# Patient Record
Sex: Female | Born: 1985 | Race: White | Hispanic: No | Smoking: Never smoker
Health system: Southern US, Community
[De-identification: ages and names within clinical notes are randomized; demographics above are authoritative.]

## PROBLEM LIST (undated history)

## (undated) DIAGNOSIS — K8020 Calculus of gallbladder without cholecystitis without obstruction: Secondary | ICD-10-CM

## (undated) HISTORY — PX: OTHER SURGICAL HISTORY: SHX169

---
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
## 2006-03-11 ENCOUNTER — Emergency Department (HOSPITAL_COMMUNITY): Admission: EM | Admit: 2006-03-11 | Discharge: 2006-03-11 | Payer: Self-pay | Admitting: Emergency Medicine

## 2007-10-31 ENCOUNTER — Emergency Department (HOSPITAL_COMMUNITY): Admission: EM | Admit: 2007-10-31 | Discharge: 2007-11-01 | Payer: Self-pay | Admitting: Emergency Medicine

## 2007-11-10 ENCOUNTER — Emergency Department (HOSPITAL_COMMUNITY): Admission: EM | Admit: 2007-11-10 | Discharge: 2007-11-10 | Payer: Self-pay | Admitting: Emergency Medicine

## 2008-02-09 ENCOUNTER — Emergency Department (HOSPITAL_COMMUNITY): Admission: EM | Admit: 2008-02-09 | Discharge: 2008-02-09 | Payer: Self-pay | Admitting: Emergency Medicine

## 2009-04-20 ENCOUNTER — Emergency Department (HOSPITAL_COMMUNITY): Admission: EM | Admit: 2009-04-20 | Discharge: 2009-04-20 | Payer: Self-pay | Admitting: Emergency Medicine

## 2009-08-08 ENCOUNTER — Inpatient Hospital Stay (HOSPITAL_COMMUNITY): Admission: AD | Admit: 2009-08-08 | Discharge: 2009-08-09 | Payer: Self-pay | Admitting: Obstetrics and Gynecology

## 2011-01-06 ENCOUNTER — Emergency Department (HOSPITAL_COMMUNITY)
Admission: EM | Admit: 2011-01-06 | Discharge: 2011-01-06 | Disposition: A | Discharge status: Home / Self Care | Payer: Self-pay | Attending: Emergency Medicine | Admitting: Emergency Medicine

## 2011-01-06 ENCOUNTER — Emergency Department (HOSPITAL_COMMUNITY): Payer: Self-pay

## 2011-01-06 DIAGNOSIS — M25519 Pain in unspecified shoulder: Secondary | ICD-10-CM | POA: Insufficient documentation

## 2011-06-23 LAB — CBC
Hemoglobin: 14.5
MCHC: 33.5
MCV: 82.1
RDW: 13.7

## 2011-06-23 LAB — DIFFERENTIAL
Basophils Absolute: 0
Eosinophils Absolute: 0.2
Eosinophils Relative: 2
Lymphocytes Relative: 19
Monocytes Absolute: 0.6
Neutro Abs: 8.1 — ABNORMAL HIGH
Neutrophils Relative %: 74

## 2011-06-23 LAB — URINE MICROSCOPIC-ADD ON

## 2011-06-23 LAB — BASIC METABOLIC PANEL
Calcium: 9.4
Chloride: 107
Creatinine, Ser: 0.7
GFR calc non Af Amer: >60
Sodium: 141

## 2011-06-23 LAB — URINALYSIS, ROUTINE W REFLEX MICROSCOPIC
Glucose, UA: NEGATIVE
Protein, ur: 30 — AB
Specific Gravity, Urine: 1.025

## 2011-11-29 ENCOUNTER — Encounter (HOSPITAL_COMMUNITY): Payer: Self-pay | Admitting: *Deleted

## 2011-11-29 ENCOUNTER — Emergency Department (HOSPITAL_COMMUNITY): Payer: Self-pay

## 2011-11-29 ENCOUNTER — Emergency Department (HOSPITAL_COMMUNITY)
Admission: EM | Admit: 2011-11-29 | Discharge: 2011-11-29 | Disposition: A | Discharge status: Home / Self Care | Payer: Self-pay | Attending: Emergency Medicine | Admitting: Emergency Medicine

## 2011-11-29 DIAGNOSIS — R059 Cough, unspecified: Secondary | ICD-10-CM | POA: Insufficient documentation

## 2011-11-29 DIAGNOSIS — B9789 Other viral agents as the cause of diseases classified elsewhere: Secondary | ICD-10-CM | POA: Insufficient documentation

## 2011-11-29 DIAGNOSIS — R05 Cough: Secondary | ICD-10-CM | POA: Insufficient documentation

## 2011-11-29 MED ORDER — HYDROCOD POLST-CHLORPHEN POLST 10-8 MG/5ML PO LQCR: 5.0000 mL | Freq: Two times a day (BID) | ORAL | Status: DC | PRN

## 2011-11-29 MED ORDER — ALBUTEROL SULFATE HFA 108 (90 BASE) MCG/ACT IN AERS
2.0000 | INHALATION_SPRAY | Freq: Four times a day (QID) | RESPIRATORY_TRACT | Status: DC
  Administered 2011-11-29: 2 via RESPIRATORY_TRACT
  Filled 2011-11-29: qty 6.7

## 2011-11-29 NOTE — ED Notes (Signed)
Pt states she started to have a productive cough. Pt states she started to cough so much she is having blood tinged sputum. Pt states she become sob after coughing. Pt also c/o right side pain after coughing. Pt states she was running a fever of 101.4.

## 2011-11-29 NOTE — Discharge Instructions (Signed)
Cough, Adult  A cough is a reflex that helps clear your throat and airways. It can help heal the body or may be a reaction to an irritated airway. A cough may only last 2 or 3 weeks (acute) or may last more than 8 weeks (chronic).  CAUSES Acute cough:  Viral or bacterial infections.  Chronic cough:  Infections.   Allergies.   Asthma.   Post-nasal drip.   Smoking.   Heartburn or acid reflux.   Some medicines.   Chronic lung problems (COPD).   Cancer.  SYMPTOMS   Cough.   Fever.   Chest pain.   Increased breathing rate.   High-pitched whistling sound when breathing (wheezing).   Colored mucus that you cough up (sputum).  TREATMENT   A bacterial cough may be treated with antibiotic medicine.   A viral cough must run its course and will not respond to antibiotics.   Your caregiver may recommend other treatments if you have a chronic cough.  HOME CARE INSTRUCTIONS   Only take over-the-counter or prescription medicines for pain, discomfort, or fever as directed by your caregiver. Use cough suppressants only as directed by your caregiver.   Use a cold steam vaporizer or humidifier in your bedroom or home to help loosen secretions.   Sleep in a semi-upright position if your cough is worse at night.   Rest as needed.   Stop smoking if you smoke.  SEEK IMMEDIATE MEDICAL CARE IF:   You have pus in your sputum.   Your cough starts to worsen.   You cannot control your cough with suppressants and are losing sleep.   You begin coughing up blood.   You have difficulty breathing.   You develop pain which is getting worse or is uncontrolled with medicine.   You have a fever.  MAKE SURE YOU:   Understand these instructions.   Will watch your condition.   Will get help right away if you are not doing well or get worse.  Document Released: 03/12/2011 Document Revised: 09/02/2011 Document Reviewed: 03/12/2011 [facility] Patient Information 2012 [place],  [place].Antibiotic Nonuse  Your caregiver felt that the infection or problem was not one that would be helped with an antibiotic. Infections may be caused by viruses or bacteria. Only a caregiver can tell which one of these is the likely cause of an illness. A cold is the most common cause of infection in both adults and children. A cold is a virus. Antibiotic treatment will have no effect on a viral infection. Viruses can lead to many lost days of work caring for sick children and many missed days of school. Children may catch as many as 10 "colds" or "flus" per year during which they can be tearful, cranky, and uncomfortable. The goal of treating a virus is aimed at keeping the ill person comfortable. Antibiotics are medications used to help the body fight bacterial infections. There are relatively few types of bacteria that cause infections but there are hundreds of viruses. While both viruses and bacteria cause infection they are very different types of germs. A viral infection will typically go away by itself within 7 to 10 days. Bacterial infections may spread or get worse without antibiotic treatment. Examples of bacterial infections are:  Sore throats (like strep throat or tonsillitis).   Infection in the lung (pneumonia).   Ear and skin infections.  Examples of viral infections are:  Colds or flus.   Most coughs and bronchitis.   Sore throats not caused  by Strep.   Runny noses.  It is often best not to take an antibiotic when a viral infection is the cause of the problem. Antibiotics can kill off the helpful bacteria that we have inside our body and allow harmful bacteria to start growing. Antibiotics can cause side effects such as allergies, nausea, and diarrhea without helping to improve the symptoms of the viral infection. Additionally, repeated uses of antibiotics can cause bacteria inside of our body to become resistant. That resistance can be passed onto harmful bacterial. The next  time you have an infection it may be harder to treat if antibiotics are used when they are not needed. Not treating with antibiotics allows our own immune system to develop and take care of infections more efficiently. Also, antibiotics will work better for [place] when they are prescribed for bacterial infections. Treatments for a child that is ill may include:  Give extra fluids throughout the day to stay hydrated.   Get plenty of rest.   Only give your child over-the-counter or prescription medicines for pain, discomfort, or fever as directed by your caregiver.   The use of a cool mist humidifier may help stuffy noses.   Cold medications if suggested by your caregiver.  Your caregiver may decide to start you on an antibiotic if:  The problem you were seen for today continues for a longer length of time than expected.   You develop a secondary bacterial infection.  SEEK MEDICAL CARE IF:  Fever lasts longer than 5 days.   Symptoms continue to get worse after 5 to 7 days or become severe.   Difficulty in breathing develops.   Signs of dehydration develop (poor drinking, rare urinating, dark colored urine).   Changes in behavior or worsening tiredness (listlessness or lethargy).  Document Released: 11/22/2001 Document Revised: 09/02/2011 Document Reviewed: 05/21/2009 [facility] Patient Information 2012 [place], [place].Viral Infections A viral infection can be caused by different types of viruses.Most viral infections are not serious and resolve on their own. However, some infections may cause severe symptoms and may lead to further complications. SYMPTOMS Viruses can frequently cause:  Minor sore throat.   Aches and pains.   Headaches.   Runny nose.   Different types of rashes.   Watery eyes.   Tiredness.   Cough.   Loss of appetite.   Gastrointestinal infections, resulting in nausea, vomiting, and diarrhea.  These symptoms do not respond to antibiotics because the  infection is not caused by bacteria. However, you might catch a bacterial infection following the viral infection. This is sometimes called a "superinfection." Symptoms of such a bacterial infection may include:  Worsening sore throat with pus and difficulty swallowing.   Swollen neck glands.   Chills and a high or persistent fever.   Severe headache.   Tenderness over the sinuses.   Persistent overall ill feeling (malaise), muscle aches, and tiredness (fatigue).   Persistent cough.   Yellow, green, or brown mucus production with coughing.  HOME CARE INSTRUCTIONS   Only take over-the-counter or prescription medicines for pain, discomfort, diarrhea, or fever as directed by your caregiver.   Drink enough water and fluids to keep your urine clear or pale yellow. Sports drinks can provide valuable electrolytes, sugars, and hydration.   Get plenty of rest and maintain proper nutrition. Soups and broths with crackers or rice are fine.  SEEK IMMEDIATE MEDICAL CARE IF:   You have severe headaches, shortness of breath, chest pain, neck pain, or an unusual rash.  You have uncontrolled vomiting, diarrhea, or you are unable to keep down fluids.   You or your child has an oral temperature above 102 F (38.9 C), not controlled by medicine.   Your baby is older than 3 months with a rectal temperature of 102 F (38.9 C) or higher.   Your baby is 69 months old or younger with a rectal temperature of 100.4 F (38 C) or higher.  MAKE SURE YOU:   Understand these instructions.   Will watch your condition.   Will get help right away if you are not doing well or get worse.  Document Released: 06/23/2005 Document Revised: 09/02/2011 Document Reviewed: 01/18/2011 [facility] Patient Information 2012 [place], [place].   Please read the information above. You may return to the ER at any time for worsening condition or any new symptoms that concern you.

## 2011-11-29 NOTE — ED Provider Notes (Signed)
History     CSN: [id number]  Arrival date & time 11/29/11  1138   First MD Initiated Contact with Patient 11/29/11 1221      Chief Complaint  Patient presents with  . Cough    (Consider location/radiation/quality/duration/timing/severity/associated sxs/prior treatment) HPI Comments: Patient reports she has had a cough with headache and fever for three days.  States that the cough is really bad and she sometimes get short of breath after the cough.  Cough is productive of green and yellow mucus with blood in it.  Patient is unable to quantify the amount of blood.  She is also sore on her sides from coughing.  Fever has been as high as 101.4.  Patient does not have a history of asthma and does not smoke, but her husband does smoke.  Patient is a 26 y.o. female presenting with cough. The history is provided by the patient.  Cough Associated symptoms include shortness of breath. Pertinent negatives include no rhinorrhea, no sore throat and no wheezing.    History reviewed. No pertinent past medical history.  History reviewed. No pertinent past surgical history.  No family history on file.  History  Substance Use Topics  . Smoking status: Never Smoker   . Smokeless tobacco: Not on file  . Alcohol Use: No    OB History    Grav Para Term Preterm Abortions TAB SAB Ect Mult Living                  Review of Systems  Constitutional: Positive for fever.  HENT: Negative for congestion, sore throat, rhinorrhea and trouble swallowing.   Respiratory: Positive for cough and shortness of breath. Negative for wheezing and stridor.     Allergies  Review of patient's allergies indicates no known allergies.  Home Medications   Current Outpatient Rx  Name Route Sig Dispense Refill  . ETONOGESTREL 68 MG [place] IMPL Subcutaneous Inject 1 each into the skin once. Every 3 years.      BP 125/77  Pulse 92  Temp(Src) 98.7 F (37.1 C) (Oral)  Resp 22  Ht 5\' 1"  (1.549 m)  Wt 185 lb (83.915  kg)  BMI 34.96 kg/m2  SpO2 99%  LMP 11/09/2011  Physical Exam  Nursing note and vitals reviewed. Constitutional: She is oriented to person, place, and time. She appears well-developed and well-nourished. No distress.  HENT:  Head: Normocephalic and atraumatic.  Mouth/Throat: Uvula is midline. No uvula swelling. No posterior oropharyngeal edema or tonsillar abscesses.  Neck: Trachea normal, normal range of motion and phonation normal. Neck supple. No tracheal tenderness present. No rigidity. No tracheal deviation and normal range of motion present.  Cardiovascular: Normal rate and regular rhythm.   Pulmonary/Chest: Effort normal and breath sounds normal. No stridor. No respiratory distress. She has no decreased breath sounds. She has no wheezes. She has no rales.       Diffuse coarse breath sounds with coughing  Neurological: She is alert and oriented to person, place, and time.  Skin: She is not diaphoretic.    ED Course  Procedures (including critical care time)  Labs Reviewed - No data to display Dg Chest 2 View  11/29/2011  *RADIOLOGY REPORT*  Clinical Data: Cough, fever  CHEST - 2 VIEW  Comparison: 10/31/2007  Findings: Lungs are clear. No pleural effusion or pneumothorax.  Cardiomediastinal silhouette is within normal limits.  Visualized osseous structures are within normal limits.  IMPRESSION: No evidence of acute cardiopulmonary disease.  Original Report Authenticated  By: Charline Bills, M.D.     1. Viral respiratory illness       MDM  Afebrile patient with three days of cough and cold symptoms.  O2 saturation 99%, chest xray negative.  Doubt pneumonia. Pt d/c home with albuterol inhaler and cough medication.  Patient verbalizes understanding and agrees with plan.          Rise Patience, [place] 11/29/11 1534

## 2011-11-29 NOTE — ED Provider Notes (Signed)
Medical screening examination/treatment/procedure(s) were performed by non-physician practitioner and as supervising physician I was immediately available for consultation/collaboration.   Deletha Jaffee A Keilani Terrance, MD 11/29/11 1637 

## 2011-11-29 NOTE — Progress Notes (Signed)
pt confirms Dr Vear Clock, [facility] Most is her pcp and she is self pay

## 2012-02-18 ENCOUNTER — Emergency Department (HOSPITAL_COMMUNITY)
Admission: EM | Admit: 2012-02-18 | Discharge: 2012-02-18 | Disposition: A | Discharge status: Home / Self Care | Payer: Self-pay | Attending: Emergency Medicine | Admitting: Emergency Medicine

## 2012-02-18 ENCOUNTER — Encounter (HOSPITAL_COMMUNITY): Payer: Self-pay | Admitting: Emergency Medicine

## 2012-02-18 DIAGNOSIS — J309 Allergic rhinitis, unspecified: Secondary | ICD-10-CM | POA: Insufficient documentation

## 2012-02-18 DIAGNOSIS — J302 Other seasonal allergic rhinitis: Secondary | ICD-10-CM

## 2012-02-18 MED ORDER — LEVOCETIRIZINE DIHYDROCHLORIDE 5 MG PO TABS: 5.0000 mg | ORAL_TABLET | Freq: Every evening | ORAL | Status: DC

## 2012-02-18 MED ORDER — BUDESONIDE 32 MCG/ACT NA SUSP: 1.0000 | Freq: Every day | NASAL | Status: DC

## 2012-02-18 MED ORDER — OLOPATADINE HCL 0.1 % OP SOLN
1.0000 [drp] | Freq: Two times a day (BID) | OPHTHALMIC | Status: DC
  Administered 2012-02-18: 1 [drp] via OPHTHALMIC
  Filled 2012-02-18: qty 5

## 2012-02-18 MED ORDER — DEXAMETHASONE SODIUM PHOSPHATE 10 MG/ML IJ SOLN
10.0000 mg | Freq: Once | INTRAMUSCULAR | Status: AC
  Administered 2012-02-18: 10 mg via INTRAMUSCULAR
  Filled 2012-02-18: qty 1

## 2012-02-18 NOTE — Discharge Instructions (Signed)
Return here as needed for any worsening in your condition.  Increase your fluid intake

## 2012-02-18 NOTE — ED Notes (Signed)
Pt reports itchy watery eyes, sinus drainage x 1 week. Sx unresponsive to OTC meds

## 2012-02-18 NOTE — ED Provider Notes (Signed)
Medical screening examination/treatment/procedure(s) were performed by non-physician practitioner and as supervising physician I was immediately available for consultation/collaboration.   Lyanne Co, MD 02/18/12 [phone]

## 2012-02-18 NOTE — ED Provider Notes (Signed)
History     CSN: [id number]  Arrival date & time 02/18/12  1152   First MD Initiated Contact with Patient 02/18/12 1156      Chief Complaint  Patient presents with  . Allergies  . Itchy Eye    (Consider location/radiation/quality/duration/timing/severity/associated sxs/prior treatment) HPI Patient comes in complaining of seasonal allergy symptoms for the last week.  Says that she has this problem every year around this time.  Patient has fever, shortness of breath, chest pain, nausea, vomiting, sore throat, or earache.  Her symptoms are watery, itchy eyes, nasal congestion, and nasal drainage.  She, states she has taken Benadryl, Allegra, and Zyrtec with no relief. History reviewed. No pertinent past medical history.  History reviewed. No pertinent past surgical history.  Family History  Problem Relation Age of Onset  . Diabetes Mother   . Hypertension Mother     History  Substance Use Topics  . Smoking status: Never Smoker   . Smokeless tobacco: Not on file  . Alcohol Use: No    OB History    Grav Para Term Preterm Abortions TAB SAB Ect Mult Living                  Review of Systems All other systems negative except as documented in the HPI. All pertinent positives and negatives as reviewed in the HPI.  Allergies  Review of patient's allergies indicates no known allergies.  Home Medications   Current Outpatient Rx  Name Route Sig Dispense Refill  . DIPHENHYDRAMINE HCL 25 MG PO TABS Oral Take 25 mg by mouth every 6 (six) hours as needed. Allergies    . ETONOGESTREL 68 MG [place] IMPL Subcutaneous Inject 1 each into the skin once. Every 3 years.    Marland Kitchen HYDROCOD POLST-CPM POLST ER 10-8 MG/5ML PO LQCR Oral Take 5 mLs by mouth every 12 (twelve) hours as needed. 100 mL 0    BP 127/72  Pulse 83  Temp(Src) 98.5 F (36.9 C) (Oral)  Resp 18  SpO2 99%  LMP 02/11/2012  Physical Exam  Constitutional: She appears well-developed and well-nourished. No distress.  HENT:    Head: Normocephalic and atraumatic.  Right Ear: Tympanic membrane normal.  Left Ear: Tympanic membrane normal.  Nose: Mucosal edema and rhinorrhea present. No sinus tenderness or nasal deformity. Right sinus exhibits no maxillary sinus tenderness and no frontal sinus tenderness. Left sinus exhibits no maxillary sinus tenderness and no frontal sinus tenderness.  Mouth/Throat: Uvula is midline, oropharynx is clear and moist and mucous membranes are normal. No uvula swelling. No oropharyngeal exudate.  Cardiovascular: Normal rate and regular rhythm.   Pulmonary/Chest: Effort normal and breath sounds normal.    ED Course  Procedures (including critical care time)   Patient beinging treated for allergic symptoms. No signs of sinusitis  MDM          Carlyle Dolly, PA-C 02/18/12 1258  Carlyle Dolly, PA-C 02/18/12 [phone]

## 2013-04-05 ENCOUNTER — Emergency Department (HOSPITAL_COMMUNITY)
Admission: EM | Admit: 2013-04-05 | Discharge: 2013-04-05 | Disposition: A | Discharge status: Home / Self Care | Payer: Self-pay | Attending: Emergency Medicine | Admitting: Emergency Medicine

## 2013-04-05 ENCOUNTER — Encounter (HOSPITAL_COMMUNITY): Payer: Self-pay | Admitting: Emergency Medicine

## 2013-04-05 DIAGNOSIS — Z3202 Encounter for pregnancy test, result negative: Secondary | ICD-10-CM | POA: Insufficient documentation

## 2013-04-05 DIAGNOSIS — R002 Palpitations: Secondary | ICD-10-CM | POA: Insufficient documentation

## 2013-04-05 DIAGNOSIS — R0602 Shortness of breath: Secondary | ICD-10-CM | POA: Insufficient documentation

## 2013-04-05 DIAGNOSIS — M546 Pain in thoracic spine: Secondary | ICD-10-CM | POA: Insufficient documentation

## 2013-04-05 DIAGNOSIS — R51 Headache: Secondary | ICD-10-CM | POA: Insufficient documentation

## 2013-04-05 DIAGNOSIS — R0789 Other chest pain: Secondary | ICD-10-CM | POA: Insufficient documentation

## 2013-04-05 DIAGNOSIS — M62838 Other muscle spasm: Secondary | ICD-10-CM | POA: Insufficient documentation

## 2013-04-05 LAB — URINALYSIS, ROUTINE W REFLEX MICROSCOPIC
Bilirubin Urine: NEGATIVE
Glucose, UA: NEGATIVE mg/dL
Nitrite: NEGATIVE
Specific Gravity, Urine: 1.011 (ref 1.005–1.030)
pH: 6 (ref 5.0–8.0)

## 2013-04-05 LAB — CBC WITH DIFFERENTIAL/PLATELET
Basophils Absolute: 0 10*3/uL (ref 0.0–0.1)
Eosinophils Absolute: 0.3 10*3/uL (ref 0.0–0.7)
HCT: 41.7 % (ref 36.0–46.0)
Hemoglobin: 14.1 g/dL (ref 12.0–15.0)
Lymphs Abs: 2.1 10*3/uL (ref 0.7–4.0)
Neutrophils Relative %: 71 % (ref 43–77)
Platelets: 275 10*3/uL (ref 150–400)

## 2013-04-05 LAB — BASIC METABOLIC PANEL
Chloride: 105 mEq/L (ref 96–112)
Creatinine, Ser: 0.59 mg/dL (ref 0.50–1.10)
GFR calc Af Amer: >90 mL/min (ref >90)
Potassium: 3.8 mEq/L (ref 3.5–5.1)
Sodium: 138 mEq/L (ref 135–145)

## 2013-04-05 LAB — POCT PREGNANCY, URINE: Preg Test, Ur: NEGATIVE

## 2013-04-05 LAB — URINE MICROSCOPIC-ADD ON

## 2013-04-05 MED ORDER — HYDROCODONE-ACETAMINOPHEN 5-325 MG PO TABS: 1.0000 | ORAL_TABLET | Freq: Four times a day (QID) | ORAL | Status: DC | PRN

## 2013-04-05 MED ORDER — METHOCARBAMOL 500 MG PO TABS: 500.0000 mg | ORAL_TABLET | Freq: Two times a day (BID) | ORAL | Status: DC

## 2013-04-05 MED ORDER — DIAZEPAM 2 MG PO TABS: 2.0000 mg | ORAL_TABLET | Freq: Once | ORAL | Status: DC

## 2013-04-05 MED ORDER — METOCLOPRAMIDE HCL 5 MG/ML IJ SOLN
10.0000 mg | Freq: Once | INTRAMUSCULAR | Status: AC
  Administered 2013-04-05: 10 mg via INTRAVENOUS
  Filled 2013-04-05: qty 2

## 2013-04-05 MED ORDER — SODIUM CHLORIDE 0.9 % IV BOLUS (SEPSIS)
1000.0000 mL | Freq: Once | INTRAVENOUS | Status: AC
  Administered 2013-04-05: 1000 mL via INTRAVENOUS

## 2013-04-05 MED ORDER — IBUPROFEN 600 MG PO TABS: 600.0000 mg | ORAL_TABLET | Freq: Four times a day (QID) | ORAL | Status: DC | PRN

## 2013-04-05 MED ORDER — KETOROLAC TROMETHAMINE 30 MG/ML IJ SOLN
30.0000 mg | Freq: Once | INTRAMUSCULAR | Status: AC
  Administered 2013-04-05: 30 mg via INTRAVENOUS
  Filled 2013-04-05: qty 1

## 2013-04-05 NOTE — ED Provider Notes (Signed)
History    CSN: [id number] Arrival date & time 04/05/13  1327  First MD Initiated Contact with Patient 04/05/13 1419     Chief Complaint  Patient presents with  . Headache   (Consider location/radiation/quality/duration/timing/severity/associated sxs/prior Treatment) HPI Comments: Pt comes in with cc of headache, back pain, palpitations. She has no medical hx. States that she has hx of headaches. The current headaches are constant, located diffusely, and at their worst are 8/10, feels like they are 6/10 currently. The onset was gradual and 2 days ago. There is no nausea, vomiting, seizures, altered mental status, loss of consciousness, new weakness, or numbness, no gait instability - pt does indicate having some blurry vision. She also reports having upper back pain. Pt also has some palpitations, last night she felt some chest discomfort with the palpitations. Chest discomfort was left sided, and radiated to the shoulder. Denies any hx of dvt, pe - and no risk factors for the same. Pt denies any illicit use, stimulant use. No premature CAD in the family, but brother did die of aortic rupture from OD.   Patient is a 27 y.o. female presenting with headaches. The history is provided by the patient.  Headache Associated symptoms: back pain   Associated symptoms: no abdominal pain, no cough, no diarrhea, no nausea, no neck pain and no vomiting    History reviewed. No pertinent past medical history. History reviewed. No pertinent past surgical history. Family History  Problem Relation Age of Onset  . Diabetes Mother   . Hypertension Mother    History  Substance Use Topics  . Smoking status: Never Smoker   . Smokeless tobacco: Not on file  . Alcohol Use: No   OB History   Grav Para Term Preterm Abortions TAB SAB Ect Mult Living                 Review of Systems  Constitutional: Negative for activity change.  HENT: Negative for facial swelling and neck pain.   Respiratory:  Positive for shortness of breath. Negative for cough and wheezing.   Cardiovascular: Positive for chest pain and palpitations.  Gastrointestinal: Negative for nausea, vomiting, abdominal pain, diarrhea, constipation, blood in stool and abdominal distention.  Genitourinary: Negative for hematuria and difficulty urinating.  Musculoskeletal: Positive for back pain.  Skin: Negative for color change.  Neurological: Positive for headaches. Negative for speech difficulty.  Hematological: Does not bruise/bleed easily.  Psychiatric/Behavioral: Negative for confusion.    Allergies  Bromelains  Home Medications   Current Outpatient Rx  Name  Route  Sig  Dispense  Refill  . aspirin 81 MG chewable tablet   Oral   Chew 81 mg by mouth once.         . medroxyPROGESTERone (DEPO-PROVERA) 150 MG/ML injection   Intramuscular   Inject 150 mg into the muscle every 3 (three) months.         . Multiple Vitamin (MULTIVITAMIN WITH MINERALS) TABS   Oral   Take 1 tablet by mouth daily.          BP 116/82  Pulse 88  Temp(Src) 98 F (36.7 C) (Oral)  Resp 18  SpO2 100% Physical Exam  Nursing note and vitals reviewed. Constitutional: She is oriented to person, place, and time. She appears well-developed and well-nourished.  HENT:  Head: Normocephalic and atraumatic.  Eyes: EOM are normal. Pupils are equal, round, and reactive to light.  Neck: Neck supple.  Cardiovascular: Normal rate, regular rhythm and normal heart  sounds.   No murmur heard. Pulmonary/Chest: Effort normal. No respiratory distress.  Abdominal: Soft. She exhibits no distension. There is no tenderness. There is no rebound and no guarding.  Musculoskeletal:  Pt has upper thoracic pain - there is a palpable nodule, and spasms.  Neurological: She is alert and oriented to person, place, and time.  Skin: Skin is warm and dry.    ED Course  Procedures (including critical care time) Labs Reviewed  CBC WITH DIFFERENTIAL -  Abnormal; Notable for the following:    WBC 10.6 (*)    RBC 5.13 (*)    All other components within normal limits  URINALYSIS, ROUTINE W REFLEX MICROSCOPIC - Abnormal; Notable for the following:    APPearance CLOUDY (*)    Leukocytes, UA MODERATE (*)    All other components within normal limits  URINE MICROSCOPIC-ADD ON - Abnormal; Notable for the following:    Squamous Epithelial / LPF MANY (*)    Bacteria, UA MANY (*)    All other components within normal limits  URINE CULTURE  BASIC METABOLIC PANEL  TROPONIN I  POCT PREGNANCY, URINE   No results found. 1. Muscle spasm   2. Headache     MDM  Pt comes in with cc of headaches, palpitations, back pain.   Date: 04/05/2013  Rate: 80  Rhythm: normal sinus rhythm  QRS Axis: normal  Intervals: normal  ST/T Wave abnormalities: normal  Conduction Disutrbances: none  Narrative Interpretation: unremarkable S1Q3T3  HEADACHE: 2 days of headache, gradual onset, not at it's worst right nwo, and at it's worst 8/10. Does have some visual complains.  DDX includes: Primary headaches - including migrainous headaches, cluster headaches, tension headaches. ICH Carotid dissection Cavernous sinus thrombosis Meningitis Encephalitis Sinusitis Tumor Vascular headaches AV malformation Brain aneurysm Muscular headaches  No meningeal signs. Will get Neuro follow up for her. No concerns for acute life threatening headaches.  CHEST PAIN, PALPITATIONS Differential diagnosis includes: ACS syndrome CHF exacerbation Valvular disorder Myocarditis Pericarditis Pericardial effusion Pneumonia Pleural effusion Pulmonary edema PE Anemia Musculoskeletal pain  Pt's wells score is 0, she is PERC negative. S1, q3, t3 appreciated - but no other signs of right sided strain. Will request PCP follow up. Trop x 2 ordered for ACS r/o. Has no cardiac risk factors and the heart exam is benign.  BACK PAIN: Pt has spasms, palpable. Will give  muscle relaxants.     Derwood Kaplan, MD 04/05/13 [phone]

## 2013-04-05 NOTE — Progress Notes (Signed)
[facility] has seen patient and provided her with a list of primary care resources. °

## 2013-04-05 NOTE — ED Notes (Signed)
Pt presents with multiple complaints at this time. Pt complains of" headache, chest pain and heart fluttering" Pt denies nausea or vomiting at this time.

## 2013-04-06 LAB — URINE CULTURE: Colony Count: 60000

## 2013-06-28 ENCOUNTER — Encounter (HOSPITAL_COMMUNITY): Payer: Self-pay | Admitting: *Deleted

## 2013-06-28 ENCOUNTER — Emergency Department (INDEPENDENT_AMBULATORY_CARE_PROVIDER_SITE_OTHER)
Admission: EM | Admit: 2013-06-28 | Discharge: 2013-06-28 | Disposition: A | Discharge status: Home / Self Care | Payer: Self-pay | Source: Home / Self Care | Attending: Emergency Medicine | Admitting: Emergency Medicine

## 2013-06-28 DIAGNOSIS — K0530 Chronic periodontitis, unspecified: Secondary | ICD-10-CM

## 2013-06-28 MED ORDER — OXYCODONE-ACETAMINOPHEN 5-325 MG PO TABS: ORAL_TABLET | ORAL | Status: DC

## 2013-06-28 MED ORDER — METRONIDAZOLE 500 MG PO TABS: 500.0000 mg | ORAL_TABLET | Freq: Three times a day (TID) | ORAL | Status: DC

## 2013-06-28 MED ORDER — PENICILLIN V POTASSIUM 500 MG PO TABS: 500.0000 mg | ORAL_TABLET | Freq: Three times a day (TID) | ORAL | Status: DC

## 2013-06-28 NOTE — ED Provider Notes (Signed)
Chief Complaint:   Chief Complaint  Patient presents with  . Dental Pain    History of Present Illness:   Diane Taylor is a 27 year old female who has had an impacted, partially erupted right lower wisdom tooth. Recently this has become inflamed and infected. It's been draining caseous debris. It's tender to touch. She's had swelling in her jaw externally and fever of up to 102 with chills. It hurts to chew and swallow. No difficulty breathing. She has a headache in the pain radiates to her right ear and right eye. She denies any shortness of breath or chest pain.  Review of Systems:  Other than noted above, the patient denies any of the following symptoms: Systemic:  No fever, chills,  Or sweats. ENT:  No headache, ear ache, sore throat, nasal congestion, facial pain, or swelling. Lymphatic:  No adenopathy. Lungs:  No coughing, wheezing or shortness of breath.  PMFSH:  Past medical history, family history, social history, meds, and allergies were reviewed.   Physical Exam:   Vital signs:  BP 130/84  Pulse 76  Temp(Src) 98.4 F (36.9 C) (Oral)  Resp 20  SpO2 100% General:  Alert, oriented, in no distress. ENT:  TMs and canals normal.  Nasal mucosa normal. Mouth exam:  Her right, lower wisdom tooth is partially erupted, covered with an operculum, and the gingiva is swollen and tender around it. There is no visible purulent drainage. No swelling of the floor the mouth. Teeth are otherwise in good repair. The pharynx is clear and widely patent. Neck:  No swelling or adenopathy. Lungs:  Breath sounds clear and equal bilaterally.  No wheezes, rales or rhonchi. Heart:  Regular rhythm.  No gallops or murmers. Skin:  Clear, warm and dry.   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  She will need to have a wisdom tooth extracted by her dentist as soon as possible.  Plan:   1.  Meds:  The following meds were prescribed:   New Prescriptions   METRONIDAZOLE (FLAGYL) 500 MG  TABLET    Take 1 tablet (500 mg total) by mouth 3 (three) times daily.   OXYCODONE-ACETAMINOPHEN (PERCOCET) 5-325 MG PER TABLET    1 to 2 tablets every 6 hours as needed for pain.   PENICILLIN V POTASSIUM (VEETID) 500 MG TABLET    Take 1 tablet (500 mg total) by mouth 3 (three) times daily.    2.  Patient Education/Counseling:  The patient was given appropriate handouts, self care instructions, and instructed in symptomatic relief. Suggested sleeping with head of bed elevated and hot salt water mouthwash.   3.  Follow up:  The patient was told to follow up if no better in 3 to 4 days, if becoming worse in any way, and given some red flag symptoms such as difficulty swallowing or breathing which would prompt immediate return.  Follow up with a dentist as soon as posssible.     Reuben Likes, MD 06/28/13 [phone]

## 2013-06-28 NOTE — ED Notes (Signed)
Pt  Has  A  Toothache  r  Side    Face        Since  Yesterday  She  Reports         Flap  Over  Wisdom tooth  Hurting  Her          She    Ambulated  To  Room  With a  Steady  Fluid  Gait

## 2013-07-05 NOTE — ED Notes (Signed)
Pt   Phoned  requsting a  Yeast  Pill  To  Be  Phoned  In to   [facility]  [place] ok  Per  xz  Baker   To be  Phoned  In to  [facility]

## 2013-08-09 ENCOUNTER — Emergency Department (HOSPITAL_COMMUNITY)
Admission: EM | Admit: 2013-08-09 | Discharge: 2013-08-09 | Disposition: A | Discharge status: Home / Self Care | Payer: Self-pay | Attending: Emergency Medicine | Admitting: Emergency Medicine

## 2013-08-09 ENCOUNTER — Encounter (HOSPITAL_COMMUNITY): Payer: Self-pay | Admitting: Emergency Medicine

## 2013-08-09 ENCOUNTER — Emergency Department (HOSPITAL_COMMUNITY): Payer: Self-pay

## 2013-08-09 DIAGNOSIS — R109 Unspecified abdominal pain: Secondary | ICD-10-CM

## 2013-08-09 DIAGNOSIS — Z3202 Encounter for pregnancy test, result negative: Secondary | ICD-10-CM | POA: Insufficient documentation

## 2013-08-09 DIAGNOSIS — R197 Diarrhea, unspecified: Secondary | ICD-10-CM | POA: Insufficient documentation

## 2013-08-09 DIAGNOSIS — K8020 Calculus of gallbladder without cholecystitis without obstruction: Secondary | ICD-10-CM | POA: Insufficient documentation

## 2013-08-09 HISTORY — DX: Calculus of gallbladder without cholecystitis without obstruction: K80.20

## 2013-08-09 LAB — URINALYSIS, ROUTINE W REFLEX MICROSCOPIC
Ketones, ur: NEGATIVE mg/dL
Nitrite: NEGATIVE
Protein, ur: NEGATIVE mg/dL
Urobilinogen, UA: 1 mg/dL (ref 0.0–1.0)

## 2013-08-09 LAB — CBC WITH DIFFERENTIAL/PLATELET
Basophils Absolute: 0 10*3/uL (ref 0.0–0.1)
Eosinophils Absolute: 0.5 10*3/uL (ref 0.0–0.7)
Lymphs Abs: 2.4 10*3/uL (ref 0.7–4.0)
MCH: 28 pg (ref 26.0–34.0)
Neutrophils Relative %: 68 % (ref 43–77)
Platelets: 327 10*3/uL (ref 150–400)
RBC: 5.03 MIL/uL (ref 3.87–5.11)
WBC: 10.8 10*3/uL — ABNORMAL HIGH (ref 4.0–10.5)

## 2013-08-09 LAB — COMPREHENSIVE METABOLIC PANEL
BUN: 10 mg/dL (ref 6–23)
CO2: 23 mEq/L (ref 19–32)
Calcium: 9.1 mg/dL (ref 8.4–10.5)
Chloride: 103 mEq/L (ref 96–112)
Creatinine, Ser: 0.7 mg/dL (ref 0.50–1.10)
GFR calc non Af Amer: >90 mL/min (ref >90)
Sodium: 138 mEq/L (ref 135–145)
Total Bilirubin: 0.3 mg/dL (ref 0.3–1.2)

## 2013-08-09 LAB — URINE MICROSCOPIC-ADD ON

## 2013-08-09 LAB — POCT PREGNANCY, URINE: Preg Test, Ur: NEGATIVE

## 2013-08-09 LAB — LIPASE, BLOOD: Lipase: 33 U/L (ref 11–59)

## 2013-08-09 MED ORDER — SODIUM CHLORIDE 0.9 % IV BOLUS (SEPSIS)
1000.0000 mL | Freq: Once | INTRAVENOUS | Status: AC
  Administered 2013-08-09: 1000 mL via INTRAVENOUS

## 2013-08-09 MED ORDER — METOCLOPRAMIDE HCL 5 MG/ML IJ SOLN
10.0000 mg | Freq: Once | INTRAMUSCULAR | Status: AC
  Administered 2013-08-09: 10 mg via INTRAVENOUS
  Filled 2013-08-09: qty 2

## 2013-08-09 MED ORDER — METOCLOPRAMIDE HCL 10 MG PO TABS: 10.0000 mg | ORAL_TABLET | Freq: Four times a day (QID) | ORAL | Status: DC | PRN

## 2013-08-09 MED ORDER — ONDANSETRON HCL 4 MG PO TABS: 4.0000 mg | ORAL_TABLET | Freq: Four times a day (QID) | ORAL | Status: DC

## 2013-08-09 MED ORDER — DIPHENHYDRAMINE HCL 50 MG/ML IJ SOLN
25.0000 mg | Freq: Once | INTRAMUSCULAR | Status: AC
  Administered 2013-08-09: 25 mg via INTRAVENOUS
  Filled 2013-08-09: qty 1

## 2013-08-09 NOTE — ED Notes (Signed)
Pt reports 8/10 RUQ pain since 11 pm last night. Hx gallstones, states it feels the same. States she was suppose to have gallbladder removed but wasn't able to dt not having insurance. Pt reports mild nausea. Family at bedside.

## 2013-08-09 NOTE — ED Provider Notes (Signed)
I saw and evaluated the patient, reviewed the resident's note and I agree with the findings and plan. Patient with history of gallstone, experiencing RUQ pain with increasing frequency. LFTs normal. US shows stone with no cholecystitis. Pain improved with treatment, refer to surgery for outpatient evaluation.  EKG Interpretation   None         Gilda Crease, MD 08/09/13 [phone]

## 2013-08-09 NOTE — ED Notes (Signed)
Pt states she has a gall stone the size of the end of her thumb.  Two years ago she tried to have surgery, but was unable to because she did not have insurance.  Pain to RUQ that radiates to R flank and she is spitting up bile.  Pt also c/o diarrhea.

## 2013-08-09 NOTE — ED Provider Notes (Signed)
CSN: [id number]     Arrival date & time 08/09/13  1409 History   First MD Initiated Contact with Patient 08/09/13 1629     Chief Complaint  Patient presents with  . Abdominal Pain   (Consider location/radiation/quality/duration/timing/severity/associated sxs/prior Treatment) HPI Onset was last night about 11 PM. Constant since.  The pain is sharp, constant, rated as severe. Modifying factors: worse with eating, palpation.  Associated symptoms: emesis, diarrhea.  Recent medical care: none. Tried motrin without relief.   Past Medical History  Diagnosis Date  . Gall bladder stones    History reviewed. No pertinent past surgical history. Family History  Problem Relation Age of Onset  . Diabetes Mother   . Hypertension Mother    History  Substance Use Topics  . Smoking status: Never Smoker   . Smokeless tobacco: Not on file  . Alcohol Use: No   OB History   Grav Para Term Preterm Abortions TAB SAB Ect Mult Living                 Review of Systems Constitutional: Negative for fever.  Eyes: Negative for vision loss.  ENT: Negative for difficulty swallowing.  Cardiovascular: Negative for chest pain. Respiratory: Negative for respiratory distress.  Gastrointestinal:  Negative for vomiting.  Genitourinary: Negative for inability to void.  Musculoskeletal: Negative for gait problem.  Integumentary: Negative for rash.  Neurological: Negative for new focal weakness.     Allergies  Bromelains  Home Medications   Current Outpatient Rx  Name  Route  Sig  Dispense  Refill  . ibuprofen (ADVIL,MOTRIN) 200 MG tablet   Oral   Take 400 mg by mouth every 8 (eight) hours as needed for headache, mild pain, moderate pain or cramping.         . metoCLOPramide (REGLAN) 10 MG tablet   Oral   Take 1 tablet (10 mg total) by mouth every 6 (six) hours as needed for nausea or vomiting.   20 tablet   0   . ondansetron (ZOFRAN) 4 MG tablet   Oral   Take 1 tablet (4 mg total) by mouth  every 6 (six) hours.   12 tablet   0    BP 131/83  Pulse 104  Temp(Src) 98.1 F (36.7 C) (Oral)  Resp 20  Ht 5\' 1"  (1.549 m)  Wt 216 lb 1.6 oz (98.022 kg)  BMI 40.85 kg/m2  SpO2 99% Physical Exam Nursing note and vitals reviewed.  Constitutional: Pt is alert and appears stated age. Eyes: No injection, no scleral icterus. HENT: Atraumatic, airway open without erythema or exudate.  Respiratory: No respiratory distress. Equal breathing bilaterally. Cardiovascular: Normal rate. Extremities warm and well perfused.  Abdomen: Soft, non-distended, RUQ tenderness. No rebound or guarding.  MSK: Extremities are atraumatic without deformity. Skin: No rash, no wounds.   Neuro: No motor nor sensory deficit.     ED Course  Procedures (including critical care time) Labs Review Labs Reviewed  CBC WITH DIFFERENTIAL - Abnormal; Notable for the following:    WBC 10.8 (*)    All other components within normal limits  COMPREHENSIVE METABOLIC PANEL - Abnormal; Notable for the following:    Glucose, Bld 113 (*)    AST 46 (*)    All other components within normal limits  URINALYSIS, ROUTINE W REFLEX MICROSCOPIC - Abnormal; Notable for the following:    APPearance CLOUDY (*)    Leukocytes, UA SMALL (*)    All other components within normal limits  URINE  MICROSCOPIC-ADD ON - Abnormal; Notable for the following:    Squamous Epithelial / LPF MANY (*)    Bacteria, UA FEW (*)    All other components within normal limits  LIPASE, BLOOD  POCT PREGNANCY, URINE   Imaging Review US Abdomen Complete  08/09/2013   CLINICAL DATA:  Right upper quadrant pain.  History of a gallstone.  EXAM: LIMITED ABDOMEN ULTRASOUND FOR ASCITES  COMPARISON:  CT, 01/08/2009  FINDINGS: Gallbladder  2.1 cm gallstone. No gallbladder wall thickening. No pericholecystic fluid. No evidence of acute cholecystitis.  Common bile duct  Diameter: 3.1 mm.  No evidence of a duct stone.  Liver  No focal lesion identified. Within  normal limits in parenchymal echogenicity.  IVC  No abnormality visualized.  Pancreas  Visualized portion unremarkable.  Spleen  Mildly enlarged measuring 4 drain 0.1 cm with a volume of 640 mL. No splenic mass or focal lesion.  Right Kidney  Length: 11.1 cm. Echogenicity within normal limits. No mass or hydronephrosis visualized.  Left Kidney  Length: 11.6 cm. Echogenicity within normal limits. No mass or hydronephrosis visualized.  Abdominal aorta  No aneurysm  IMPRESSION: 1. 2.1 cm mobile gallstone.  No evidence of acute cholecystitis. 2. Mild splenomegaly. The spleen appears increased in size when compared to the prior CT. 3. No other abnormalities.  No acute findings.   Electronically Signed   By: Amie Portland M.D.   On: 08/09/2013 18:37    EKG Interpretation   None       MDM   1. Abdominal pain   2. Gall bladder stones    27 y.o. female w/ PMHx of gall stones presents w/ RUQ pain concerning for gall bladder disease. Pt looks well, normal vitals. Labs from triage returned. Lipase without evidence of pancreatitis. WBC slightly elevated. CMP without kidney or liver failure. Awaiting urine. Ordered RUQ [place]. IV reglan, IV benadryl, IVF for symptoms.   [place] with gallstone, no evidence for acute cholecystitis. Pt pain free on re-eval. Informed of results. Plan to f/u with gen surgery. Counseling provided regarding diagnosis, treatment plan, follow up recommendations, and return precautions. Questions answered.     I independently viewed, interpreted, and used in my medical decision making all ordered lab and imaging tests. Medical Decision Making discussed with ED attending Gilda Crease, *      Charm Barges, MD 08/09/13 [phone]

## 2014-05-10 ENCOUNTER — Emergency Department (HOSPITAL_COMMUNITY)
Admission: EM | Admit: 2014-05-10 | Discharge: 2014-05-10 | Disposition: A | Discharge status: Home / Self Care | Payer: Self-pay | Attending: Emergency Medicine | Admitting: Emergency Medicine

## 2014-05-10 ENCOUNTER — Encounter (HOSPITAL_COMMUNITY): Payer: Self-pay | Admitting: Emergency Medicine

## 2014-05-10 DIAGNOSIS — S30860A Insect bite (nonvenomous) of lower back and pelvis, initial encounter: Secondary | ICD-10-CM | POA: Insufficient documentation

## 2014-05-10 DIAGNOSIS — S90569A Insect bite (nonvenomous), unspecified ankle, initial encounter: Secondary | ICD-10-CM | POA: Insufficient documentation

## 2014-05-10 DIAGNOSIS — S00209A Unspecified superficial injury of unspecified eyelid and periocular area, initial encounter: Secondary | ICD-10-CM | POA: Insufficient documentation

## 2014-05-10 DIAGNOSIS — Z79899 Other long term (current) drug therapy: Secondary | ICD-10-CM | POA: Insufficient documentation

## 2014-05-10 DIAGNOSIS — Z8719 Personal history of other diseases of the digestive system: Secondary | ICD-10-CM | POA: Insufficient documentation

## 2014-05-10 DIAGNOSIS — Y929 Unspecified place or not applicable: Secondary | ICD-10-CM | POA: Insufficient documentation

## 2014-05-10 DIAGNOSIS — W57XXXA Bitten or stung by nonvenomous insect and other nonvenomous arthropods, initial encounter: Secondary | ICD-10-CM | POA: Insufficient documentation

## 2014-05-10 DIAGNOSIS — R21 Rash and other nonspecific skin eruption: Secondary | ICD-10-CM | POA: Insufficient documentation

## 2014-05-10 DIAGNOSIS — Y9389 Activity, other specified: Secondary | ICD-10-CM | POA: Insufficient documentation

## 2014-05-10 MED ORDER — DEXAMETHASONE SODIUM PHOSPHATE 10 MG/ML IJ SOLN
10.0000 mg | Freq: Once | INTRAMUSCULAR | Status: AC
  Administered 2014-05-10: 10 mg via INTRAMUSCULAR
  Filled 2014-05-10: qty 1

## 2014-05-10 MED ORDER — HYDROXYZINE HCL 25 MG PO TABS: 25.0000 mg | ORAL_TABLET | Freq: Four times a day (QID) | ORAL | Status: DC

## 2014-05-10 NOTE — ED Notes (Signed)
Pt was at work and felt something biting her and she started scratching and now has rash all over body ,, she states she has taken benadryl,  Used cortisone cream and hydrocortisone spray without relief,  States as soon as it gets night time it feels like something starts crawling in her body

## 2014-05-10 NOTE — Discharge Instructions (Signed)
Please followup with a primary care provider for continued evaluation and treatment.    Insect Bite Mosquitoes, flies, fleas, bedbugs, and many other insects can bite. Insect bites are different from insect stings. A sting is when venom is injected into the skin. Some insect bites can transmit infectious diseases. SYMPTOMS  Insect bites usually turn red, swell, and itch for 2 to 4 days. They often go away on their own. TREATMENT  Your caregiver may prescribe antibiotic medicines if a bacterial infection develops in the bite. HOME CARE INSTRUCTIONS  Do not scratch the bite area.  Keep the bite area clean and dry. Wash the bite area thoroughly with soap and water.  Put ice or cool compresses on the bite area.  Put ice in a plastic bag.  Place a towel between your skin and the bag.  Leave the ice on for 20 minutes, 4 times a day for the first 2 to 3 days, or as directed.  You may apply a baking soda paste, cortisone cream, or calamine lotion to the bite area as directed by your caregiver. This can help reduce itching and swelling.  Only take over-the-counter or prescription medicines as directed by your caregiver.  If you are given antibiotics, take them as directed. Finish them even if you start to feel better. You may need a tetanus shot if:  You cannot remember when you had your last tetanus shot.  You have never had a tetanus shot.  The injury broke your skin. If you get a tetanus shot, your arm may swell, get red, and feel warm to the touch. This is common and not a problem. If you need a tetanus shot and you choose not to have one, there is a rare chance of getting tetanus. Sickness from tetanus can be serious. SEEK IMMEDIATE MEDICAL CARE IF:   You have increased pain, redness, or swelling in the bite area.  You see a red line on the skin coming from the bite.  You have a fever.  You have joint pain.  You have a headache or neck pain.  You have unusual  weakness.  You have a rash.  You have chest pain or shortness of breath.  You have abdominal pain, nausea, or vomiting.  You feel unusually tired or sleepy. MAKE SURE YOU:   Understand these instructions.  Will watch your condition.  Will get help right away if you are not doing well or get worse. Document Released: 10/21/2004 Document Revised: 12/06/2011 Document Reviewed: 04/14/2011 [facility] Patient Information 2015 [employer] [place], [place]. This information is not intended to replace advice given to you by your health care provider. Make sure you discuss any questions you have with your health care provider.

## 2014-05-10 NOTE — ED Provider Notes (Signed)
CSN: [id number]     Arrival date & time 05/10/14  2056 History  This chart was scribed for non-physician practitioner working with [name], MD, by Roxy Cedar ED Scribe. This patient was seen in room WTR9/WTR9 and the patient's care was started at 9:42 PM  Chief Complaint  Patient presents with  . Rash  . Pruritis   The history is provided by the patient. No language interpreter was used.    HPI Comments: Diane Taylor is a 28 y.o. female who presents to the Emergency Department complaining of a rash that spread throughout her body, but is concentrated on her legs, groin area and stomach onset 1 week ago.  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  When she went home, she noticed the rash.  Patient states that the rash doesn't itch during the day, but "feels like there is something crawling all over" her body during the night. Pt states she has tried using camomile lotion, benadryl, hydrocotisone spray and other topical creams with minimal relief. Patient states she has not had any new rashes since she stopped going to work. Patient states she wears a shirt, jeans and socks that go up to her calves as her uniform at work.  Patient is concerned that the rash may be due to chiggers.  She states she picked at the bumps that were on her stomach.  These bumps on her stomach had red centers and caused a lot of pain. Patient is allergic to pineapples and Reglan.  Past Medical History  Diagnosis Date  . Gall bladder stones    History reviewed. No pertinent past surgical history. Family History  Problem Relation Age of Onset  . Diabetes Mother   . Hypertension Mother    History  Substance Use Topics  . Smoking status: Never Smoker   . Smokeless tobacco: Not on file  . Alcohol Use: No   OB History   Grav Para Term Preterm Abortions TAB SAB Ect Mult Living                 Review of Systems  Constitutional: Negative for  fever and chills.  HENT: Negative for congestion.   Gastrointestinal: Negative for nausea and vomiting.  Skin: Positive for rash (abdomen, groin, legs).  All other systems reviewed and are negative.   Allergies  Bromelains and Metoclopramide  Home Medications   Prior to Admission medications   Medication Sig Start Date End Date Taking? Authorizing Provider  diphenhydrAMINE (BENADRYL) 25 mg capsule Take 25 mg by mouth every 6 (six) hours as needed for itching.   Yes Historical Provider, MD   Triage Vitals: BP 133/77  Pulse 90  Temp(Src) 98.4 F (36.9 C) (Oral)  Resp 20  SpO2 98% Physical Exam  Nursing note and vitals reviewed. Constitutional: She is oriented to person, place, and time. She appears well-developed and well-nourished. No distress.  HENT:  Head: Normocephalic.  Cardiovascular: Normal rate and regular rhythm.   Pulmonary/Chest: Effort normal and breath sounds normal. No respiratory distress. She has no wheezes.  Abdominal: Soft.  Neurological: She is alert and oriented to person, place, and time.  Skin: Skin is warm and dry.  Multiple sporadic erythematous lesions to the lower extremities and lower abdomen. There are secondary excoriations in the roofing. No signs of secondary skin infection or cellulitis. No vesicles or pustules. No rash on the palms or soles. Some lesions are grouped  Psychiatric: She  has a normal mood and affect. Her behavior is normal.    ED Course  Procedures   DIAGNOSTIC STUDIES: Oxygen Saturation is 98% on RA, normal by my interpretation.    COORDINATION OF CARE: 9:46 PM- Discussed home care with patient and advised her to wash out all her clothes with hot water. Pt advised of plan for treatment and pt agrees.   MDM   Final diagnoses:  Insect bites    I personally performed the services described in this documentation, which was scribed in my presence. The recorded information has been reviewed and is accurate.      Angus Sellereter S  Ophelia Sipe, PA-C 05/10/14 2221

## 2014-05-11 NOTE — ED Provider Notes (Signed)
Medical screening examination/treatment/procedure(s) were performed by non-physician practitioner and as supervising physician I was immediately available for consultation/collaboration.   EKG Interpretation None        Jaelani Posa, MD 05/11/14 1625 

## 2016-10-23 ENCOUNTER — Encounter (HOSPITAL_COMMUNITY): Payer: Self-pay | Admitting: Nurse Practitioner

## 2016-10-23 ENCOUNTER — Emergency Department (HOSPITAL_COMMUNITY): Payer: Self-pay

## 2016-10-23 DIAGNOSIS — J111 Influenza due to unidentified influenza virus with other respiratory manifestations: Secondary | ICD-10-CM | POA: Insufficient documentation

## 2016-10-23 LAB — CBC WITH DIFFERENTIAL/PLATELET
BASOS ABS: 0 10*3/uL (ref 0.0–0.1)
Basophils Relative: 0 %
EOS PCT: 5 %
Eosinophils Absolute: 0.5 10*3/uL (ref 0.0–0.7)
HCT: 39.6 % (ref 36.0–46.0)
HEMOGLOBIN: 13.2 g/dL (ref 12.0–15.0)
LYMPHS ABS: 2 10*3/uL (ref 0.7–4.0)
LYMPHS PCT: 24 %
MCH: 26.6 pg (ref 26.0–34.0)
MCHC: 33.3 g/dL (ref 30.0–36.0)
MCV: 79.8 fL (ref 78.0–100.0)
Monocytes Absolute: 0.8 10*3/uL (ref 0.1–1.0)
Monocytes Relative: 9 %
NEUTROS ABS: 5.3 10*3/uL (ref 1.7–7.7)
NEUTROS PCT: 62 %
PLATELETS: 290 10*3/uL (ref 150–400)
RBC: 4.96 MIL/uL (ref 3.87–5.11)
RDW: 13.9 % (ref 11.5–15.5)
WBC: 8.6 10*3/uL (ref 4.0–10.5)

## 2016-10-23 NOTE — ED Triage Notes (Signed)
Pt states "I have pneumonia. This how I felt when I had pnemonia an year ago." C/o cough with blood tinged sputum, malaise and chest soreness.

## 2016-10-24 ENCOUNTER — Emergency Department (HOSPITAL_COMMUNITY)
Admission: EM | Admit: 2016-10-24 | Discharge: 2016-10-24 | Disposition: A | Discharge status: Home / Self Care | Payer: Self-pay | Attending: Emergency Medicine | Admitting: Emergency Medicine

## 2016-10-24 DIAGNOSIS — J111 Influenza due to unidentified influenza virus with other respiratory manifestations: Secondary | ICD-10-CM

## 2016-10-24 DIAGNOSIS — R059 Cough, unspecified: Secondary | ICD-10-CM

## 2016-10-24 DIAGNOSIS — R05 Cough: Secondary | ICD-10-CM

## 2016-10-24 DIAGNOSIS — R69 Illness, unspecified: Secondary | ICD-10-CM

## 2016-10-24 LAB — COMPREHENSIVE METABOLIC PANEL
ALBUMIN: 3.9 g/dL (ref 3.5–5.0)
ALT: 30 U/L (ref 14–54)
AST: 38 U/L (ref 15–41)
Alkaline Phosphatase: 72 U/L (ref 38–126)
Anion gap: 9 (ref 5–15)
BUN: 9 mg/dL (ref 6–20)
CHLORIDE: 107 mmol/L (ref 101–111)
CO2: 23 mmol/L (ref 22–32)
CREATININE: 0.69 mg/dL (ref 0.44–1.00)
Calcium: 8.7 mg/dL — ABNORMAL LOW (ref 8.9–10.3)
GFR calc Af Amer: >60 mL/min (ref >60)
Glucose, Bld: 119 mg/dL — ABNORMAL HIGH (ref 65–99)
POTASSIUM: 3.7 mmol/L (ref 3.5–5.1)
SODIUM: 139 mmol/L (ref 135–145)
Total Bilirubin: 0.7 mg/dL (ref 0.3–1.2)
Total Protein: 7.1 g/dL (ref 6.5–8.1)

## 2016-10-24 MED ORDER — BENZONATATE 100 MG PO CAPS: 100.0000 mg | ORAL_CAPSULE | Freq: Three times a day (TID) | ORAL | 0 refills | Status: DC | PRN

## 2016-10-24 MED ORDER — ALBUTEROL SULFATE (2.5 MG/3ML) 0.083% IN NEBU
5.0000 mg | INHALATION_SOLUTION | Freq: Once | RESPIRATORY_TRACT | Status: AC
  Administered 2016-10-24: 5 mg via RESPIRATORY_TRACT
  Filled 2016-10-24: qty 6

## 2016-10-24 MED ORDER — ALBUTEROL SULFATE HFA 108 (90 BASE) MCG/ACT IN AERS
2.0000 | INHALATION_SPRAY | RESPIRATORY_TRACT | Status: DC | PRN
  Filled 2016-10-24: qty 6.7

## 2016-10-24 MED ORDER — AEROCHAMBER PLUS FLO-VU MEDIUM MISC
1.0000 | Freq: Once | Status: AC
  Administered 2016-10-24: 1
  Filled 2016-10-24: qty 1

## 2016-10-24 NOTE — Discharge Instructions (Signed)
1. Medications: albuterol, mucinex, tessalon, usual home medications 2. Treatment: rest, drink plenty of fluids, take tylenol or ibuprofen for fever control 3. Follow Up: Please followup with your primary doctor in 3 days for discussion of your diagnoses and further evaluation after today's visit; if you do not have a primary care doctor use the resource guide provided to find one; Return to the ER for high fevers, difficulty breathing or other concerning symptoms

## 2016-10-24 NOTE — ED Provider Notes (Signed)
[facility]-EMERGENCY DEPT Provider Note   CSN: [id number] Arrival date & time: 10/23/16  2303     History   Chief Complaint Chief Complaint  Patient presents with  . Cough  . "I have Pneumonia"    HPI Diane Taylor is a 31 y.o. female with a hx of Gallstones presents to the Emergency Department complaining of gradual, persistent, progressively worsening cough and nasal congestion onset 3 days ago. Patient has associated rhinorrhea, sinus pressure, sore throat, malaise. She reports that occasionally she has seen some blood-tinged use them. She also complains of chest soreness with coughing. She denies fevers or chills. She did not have a flu shot that she her. No abdominal pain, nausea or vomiting. Patient has attempted over-the-counter cough medication without relief. She has no history of asthma. She does not smoke.      The history is provided by the patient and medical records. No language interpreter was used.    Past Medical History:  Diagnosis Date  . Gall bladder stones     There are no active problems to display for this patient.   History reviewed. No pertinent surgical history.  OB History    No data available       Home Medications    Prior to Admission medications   Medication Sig Start Date End Date Taking? Authorizing Provider  acetaminophen (TYLENOL) 500 MG tablet Take 500 mg by mouth every 6 (six) hours as needed for moderate pain.   Yes Historical Provider, MD  ibuprofen (ADVIL,MOTRIN) 200 MG tablet Take 400 mg by mouth every 6 (six) hours as needed for moderate pain.   Yes Historical Provider, MD  pseudoephedrine-guaifenesin (MUCINEX D) 60-600 MG 12 hr tablet Take 1 tablet by mouth 2 (two) times daily as needed for congestion.   Yes Historical Provider, MD  benzonatate (TESSALON PERLES) 100 MG capsule Take 1 capsule (100 mg total) by mouth 3 (three) times daily as needed for cough (cough). 10/24/16   Dahlia Client Ayona Yniguez, PA-C    Family  History Family History  Problem Relation Age of Onset  . Diabetes Mother   . Hypertension Mother     Social History Social History  Substance Use Topics  . Smoking status: Never Smoker  . Smokeless tobacco: Not on file  . Alcohol use No     Allergies   Bromelains [pineapple extract] and Metoclopramide   Review of Systems Review of Systems  Constitutional: Positive for fatigue.  HENT: Positive for congestion, rhinorrhea, sinus pressure and sore throat.   Respiratory: Positive for cough and wheezing.   All other systems reviewed and are negative.    Physical Exam Updated Vital Signs BP 129/80 (BP Location: Left Arm)   Pulse 73   Temp 98.8 F (37.1 C) (Oral)   Resp 14   SpO2 98%   Physical Exam  Constitutional: She appears well-developed and well-nourished. No distress.  Awake, alert, nontoxic appearance  HENT:  Head: Normocephalic and atraumatic.  Right Ear: Tympanic membrane, external ear and ear canal normal.  Left Ear: Tympanic membrane, external ear and ear canal normal.  Nose: Mucosal edema and rhinorrhea present. No epistaxis. Right sinus exhibits no maxillary sinus tenderness and no frontal sinus tenderness. Left sinus exhibits no maxillary sinus tenderness and no frontal sinus tenderness.  Mouth/Throat: Uvula is midline, oropharynx is clear and moist and mucous membranes are normal. Mucous membranes are not pale and not cyanotic. No oropharyngeal exudate, posterior oropharyngeal edema, posterior oropharyngeal erythema or tonsillar abscesses.  Eyes: Conjunctivae  are normal. Pupils are equal, round, and reactive to light. No scleral icterus.  Neck: Normal range of motion and full passive range of motion without pain. Neck supple.  Cardiovascular: Normal rate, regular rhythm and intact distal pulses.   Pulmonary/Chest: Effort normal. No accessory muscle usage or stridor. No tachypnea. No respiratory distress. She has wheezes ( expiratory). She has rhonchi (  throughout).  Clear and equal breath sounds without focal wheezes, rhonchi, rales  Abdominal: Soft. Bowel sounds are normal. She exhibits no mass. There is no tenderness. There is no rebound and no guarding.  Musculoskeletal: Normal range of motion. She exhibits no edema.  Lymphadenopathy:    She has no cervical adenopathy.  Neurological: She is alert.  Speech is clear and goal oriented Moves extremities without ataxia  Skin: Skin is warm and dry. No rash noted. She is not diaphoretic.  Psychiatric: She has a normal mood and affect.  Nursing note and vitals reviewed.    ED Treatments / Results  Labs (all labs ordered are listed, but only abnormal results are displayed) Labs Reviewed  COMPREHENSIVE METABOLIC PANEL - Abnormal; Notable for the following:       Result Value   Glucose, Bld 119 (*)    Calcium 8.7 (*)    All other components within normal limits  CBC WITH DIFFERENTIAL/PLATELET     Radiology Dg Chest 2 View  Result Date: 10/24/2016 CLINICAL DATA:  Initial valuation for acute cough, shortness breath, congestion. EXAM: CHEST  2 VIEW COMPARISON:  Prior radiograph from 11/29/2011. FINDINGS: The cardiac and mediastinal silhouettes are stable in size and contour, and remain within normal limits. The lungs are normally inflated. No airspace consolidation, pleural effusion, or pulmonary edema is identified. There is no pneumothorax. No acute osseous abnormality identified. IMPRESSION: No active cardiopulmonary disease. Electronically Signed   By: Rise MuBenjamin  McClintock M.D.   On: 10/24/2016 00:40    Procedures Procedures (including critical care time)  Medications Ordered in ED Medications  albuterol (PROVENTIL HFA;VENTOLIN HFA) 108 (90 Base) MCG/ACT inhaler 2 puff (not administered)  albuterol (PROVENTIL) (2.5 MG/3ML) 0.083% nebulizer solution 5 mg (5 mg Nebulization Given 10/24/16 0511)  AEROCHAMBER PLUS FLO-VU MEDIUM MISC 1 each (1 each Other Given 10/24/16 0600)      Initial Impression / Assessment and Plan / ED Course  I have reviewed the triage vital signs and the nursing notes.  Pertinent labs & imaging results that were available during my care of the patient were reviewed by me and considered in my medical decision making (see chart for details).  Clinical Course as of Oct 24 634  Wynelle LinkSun Oct 24, 2016  [id number] Discussed risk and benefit of Tamiflu including side effects and patient's onset of symptoms being greater than 48 hours and decreased potential benefit. Patient was offered a prescription and declines at this time.  [HM]  [phone] Patient is afebrile without tachycardia or hypotension. Temp: 98.8 F (37.1 C) [HM]  0549 No leukocytosis WBC: 8.6 [HM]    Clinical Course User Index [HM] Johnette Teigen, PA-C    Pt CXR negative for acute infiltrate. Patients symptoms are consistent with URI, likely viral etiology. Suspect influenza. Lung exam improved after albuterol inhaler. Resolution of wheezes. No hypoxia. Discussed that antibiotics are not indicated for viral infections. Pt will be discharged with symptomatic treatment.  Verbalizes understanding and is agreeable with plan. Pt is hemodynamically stable & in NAD prior to dc.   Final Clinical Impressions(s) / ED Diagnoses   Final diagnoses:  Cough  Influenza-like illness    New Prescriptions Discharge Medication List as of 10/24/2016  5:51 AM    START taking these medications   Details  benzonatate (TESSALON PERLES) 100 MG capsule Take 1 capsule (100 mg total) by mouth 3 (three) times daily as needed for cough (cough)., Starting Sun 10/24/2016, Print         Dyna Figuereo, PA-C 10/24/16 1610    Pricilla Loveless, MD 10/29/16 [phone]

## 2017-01-26 ENCOUNTER — Emergency Department (HOSPITAL_COMMUNITY)
Admission: EM | Admit: 2017-01-26 | Discharge: 2017-01-26 | Disposition: A | Discharge status: Home / Self Care | Payer: Self-pay | Attending: Emergency Medicine | Admitting: Emergency Medicine

## 2017-01-26 ENCOUNTER — Encounter (HOSPITAL_COMMUNITY): Payer: Self-pay

## 2017-01-26 DIAGNOSIS — R1011 Right upper quadrant pain: Secondary | ICD-10-CM | POA: Insufficient documentation

## 2017-01-26 DIAGNOSIS — R1012 Left upper quadrant pain: Secondary | ICD-10-CM | POA: Insufficient documentation

## 2017-01-26 DIAGNOSIS — R1010 Upper abdominal pain, unspecified: Secondary | ICD-10-CM

## 2017-01-26 LAB — COMPREHENSIVE METABOLIC PANEL
ALBUMIN: 4 g/dL (ref 3.5–5.0)
ALK PHOS: 66 U/L (ref 38–126)
ALT: 34 U/L (ref 14–54)
ANION GAP: 8 (ref 5–15)
AST: 43 U/L — ABNORMAL HIGH (ref 15–41)
BUN: 13 mg/dL (ref 6–20)
CALCIUM: 9.2 mg/dL (ref 8.9–10.3)
CO2: 23 mmol/L (ref 22–32)
Chloride: 108 mmol/L (ref 101–111)
Creatinine, Ser: 0.76 mg/dL (ref 0.44–1.00)
GFR calc non Af Amer: >60 mL/min (ref >60)
GLUCOSE: 114 mg/dL — AB (ref 65–99)
Potassium: 3.9 mmol/L (ref 3.5–5.1)
SODIUM: 139 mmol/L (ref 135–145)
Total Bilirubin: 0.5 mg/dL (ref 0.3–1.2)
Total Protein: 7.2 g/dL (ref 6.5–8.1)

## 2017-01-26 LAB — CBC
HEMATOCRIT: 42 % (ref 36.0–46.0)
HEMOGLOBIN: 13.9 g/dL (ref 12.0–15.0)
MCH: 27 pg (ref 26.0–34.0)
MCHC: 33.1 g/dL (ref 30.0–36.0)
MCV: 81.7 fL (ref 78.0–100.0)
Platelets: 333 10*3/uL (ref 150–400)
RBC: 5.14 MIL/uL — AB (ref 3.87–5.11)
RDW: 13.6 % (ref 11.5–15.5)
WBC: 11.7 10*3/uL — ABNORMAL HIGH (ref 4.0–10.5)

## 2017-01-26 LAB — LIPASE, BLOOD: LIPASE: 32 U/L (ref 11–51)

## 2017-01-26 NOTE — Discharge Instructions (Signed)
Please return or seek further medical care if your symptoms recur, you continue to have diarrhea, you begin having fevers, or any other concerning symptoms.

## 2017-01-26 NOTE — ED Provider Notes (Signed)
[facility]-EMERGENCY DEPT Provider Note   CSN: [id number] Arrival date & time: 01/26/17  0212     History   Chief Complaint Chief Complaint  Patient presents with  . Abdominal Pain    HPI Diane Taylor is a 31 y.o. female.  She has a past medical history of cholelithiasis, tubal ligation.   She woke up with crampy abdominal pain at 1am.  Her pain was 7/10, located across both upper quadrants and feels like a band around her upper abdomen.  Her pain initially radiated to her lower abdomen, however that resolved after a BM.  Her BM was dark red diarrhea.  Her pain was not associated with N/V, no chest pain, SOB, fevers, chills.  Pain was not changed with eating or movement.  Did not try medications for symptom relief PTA.  Her pain has gradually decreased since arrival in the ED.  At the time of evaluation patient is currently reporting no pain (0 out of 10) with no symptoms currently present.     She denies recent history of bloody bowel movements however notes she has an ulcer in her stomach and when that bleeds she has dark, tarry bowel movements, none recently.       Past Medical History:  Diagnosis Date  . Gall bladder stones     There are no active problems to display for this patient.   History reviewed. No pertinent surgical history.  OB History    No data available       Home Medications    Prior to Admission medications   Medication Sig Start Date End Date Taking? Authorizing Provider  acetaminophen (TYLENOL) 500 MG tablet Take 500 mg by mouth every 6 (six) hours as needed for moderate pain.   Yes Historical Provider, MD  ibuprofen (ADVIL,MOTRIN) 200 MG tablet Take 400 mg by mouth every 6 (six) hours as needed for moderate pain.   Yes Historical Provider, MD  benzonatate (TESSALON PERLES) 100 MG capsule Take 1 capsule (100 mg total) by mouth 3 (three) times daily as needed for cough (cough). Patient not taking: Reported on 01/26/2017 10/24/16   Dahlia Client  Muthersbaugh, PA-C    Family History Family History  Problem Relation Age of Onset  . Diabetes Mother   . Hypertension Mother     Social History Social History  Substance Use Topics  . Smoking status: Never Smoker  . Smokeless tobacco: Never Used  . Alcohol use No     Allergies   Bromelains [pineapple extract] and Metoclopramide   Review of Systems Review of Systems  Constitutional: Negative for appetite change, chills, diaphoresis, fatigue and fever.  HENT: Negative for ear pain and sore throat.   Eyes: Negative for pain.  Respiratory: Negative for cough, chest tightness and shortness of breath.   Cardiovascular: Negative for chest pain, palpitations and leg swelling.  Gastrointestinal: Positive for abdominal pain (Was 7/10 earlier, currently no pain), blood in stool and diarrhea. Negative for nausea, rectal pain and vomiting.  Genitourinary: Negative for decreased urine volume, difficulty urinating, dysuria, flank pain, frequency, hematuria, pelvic pain, urgency, vaginal discharge and vaginal pain.  Musculoskeletal: Negative for arthralgias and back pain.  Skin: Negative for color change and rash.  Neurological: Negative for seizures and syncope.  All other systems reviewed and are negative.    Physical Exam Updated Vital Signs BP 136/80   Pulse 70   Temp 98.1 F (36.7 C) (Oral)   Resp 18   Ht  (1.575 m)  Wt 104.1 kg   SpO2 97%   BMI 41.96 kg/m   Physical Exam  Constitutional: She appears well-developed and well-nourished.  Non-toxic appearance. No distress.  HENT:  Head: Normocephalic and atraumatic.  Right Ear: External ear normal.  Left Ear: External ear normal.  Nose: Nose normal.  Mouth/Throat: Oropharynx is clear and moist.  Eyes: Conjunctivae and EOM are normal. Pupils are equal, round, and reactive to light. Right eye exhibits no discharge. Left eye exhibits no discharge. No scleral icterus.  Neck: Normal range of motion. Neck supple. No  tracheal deviation present.  Cardiovascular: Normal rate, regular rhythm and normal heart sounds.   No murmur heard. Pulmonary/Chest: Effort normal and breath sounds normal. No stridor. No respiratory distress. She has no wheezes.  Abdominal: Soft. Normal appearance and bowel sounds are normal. She exhibits no distension, no ascites and no mass. There is tenderness in the left upper quadrant. There is no rigidity, no rebound, no guarding and negative Murphy's sign.  Mild tenderness (2/10) to RUQ with deep palpation.  Otherwise non tender abdomen.  Genitourinary:  Genitourinary Comments: Rectal exam refused by patient  Musculoskeletal: She exhibits no edema or deformity.  Neurological: She is alert. No cranial nerve deficit. She exhibits normal muscle tone.  Skin: Skin is warm and dry. She is not diaphoretic.  Psychiatric: She has a normal mood and affect. Her behavior is normal.  Nursing note and vitals reviewed.    ED Treatments / Results  Labs (all labs ordered are listed, but only abnormal results are displayed) Labs Reviewed  COMPREHENSIVE METABOLIC PANEL - Abnormal; Notable for the following:       Result Value   Glucose, Bld 114 (*)    AST 43 (*)    All other components within normal limits  CBC - Abnormal; Notable for the following:    WBC 11.7 (*)    RBC 5.14 (*)    All other components within normal limits  LIPASE, BLOOD  URINALYSIS, ROUTINE W REFLEX MICROSCOPIC  PREGNANCY, URINE    EKG  EKG Interpretation None       Radiology No results found.  Procedures Procedures (including critical care time)  Medications Ordered in ED Medications - No data to display   Initial Impression / Assessment and Plan / ED Course  I have reviewed the triage vital signs and the nursing notes.  Pertinent labs & imaging results that were available during my care of the patient were reviewed by me and considered in my medical decision making (see chart for details).      Patient is nontoxic, nonseptic appearing, in no apparent distress.  Patient's pain and other symptoms adequately managed in emergency department.  Labs and vitals reviewed.  Patient does not meet the SIRS or Sepsis criteria.  On exam patient does not have a surgical abdomen and there are no peritoneal signs.  No indication of appendicitis, bowel obstruction, bowel perforation, cholecystitis, diverticulitis, PID or ectopic pregnancy.  Patient discharged home with symptomatic treatment and given strict instructions for follow-up.  Patient refused rectal exam with hemoccult testing.  I have also discussed reasons to return immediately to the ER.  Patient expresses understanding and agrees with plan.  At the time of discharge patient was pain free (unless deep palpation to RUQ) and symptom free.  The patient was discussed with Dr. Read Drivers who agrees with my plan.    Final Clinical Impressions(s) / ED Diagnoses   Final diagnoses:  Pain of upper abdomen  New Prescriptions New Prescriptions   No medications on file     Cristina Gong, Cordelia Poche 01/26/17 1610    Paula Libra, MD 01/26/17 [phone]

## 2017-01-26 NOTE — ED Notes (Signed)
Pt aware that a urine is needed, but is unable to urinate at this time.  RN notifed.

## 2017-01-26 NOTE — ED Triage Notes (Signed)
Pt complains of severe abd pain that woke her up with one episode of dark red diarrhea Pt states that she ate corn beef and sauerkraut for dinner

## 2017-04-17 ENCOUNTER — Emergency Department (HOSPITAL_COMMUNITY)
Admission: EM | Admit: 2017-04-17 | Discharge: 2017-04-17 | Disposition: A | Discharge status: Home / Self Care | Payer: Self-pay | Attending: Emergency Medicine | Admitting: Emergency Medicine

## 2017-04-17 ENCOUNTER — Encounter (HOSPITAL_COMMUNITY): Payer: Self-pay

## 2017-04-17 DIAGNOSIS — J029 Acute pharyngitis, unspecified: Secondary | ICD-10-CM | POA: Insufficient documentation

## 2017-04-17 DIAGNOSIS — Z20818 Contact with and (suspected) exposure to other bacterial communicable diseases: Secondary | ICD-10-CM | POA: Insufficient documentation

## 2017-04-17 DIAGNOSIS — J069 Acute upper respiratory infection, unspecified: Secondary | ICD-10-CM | POA: Insufficient documentation

## 2017-04-17 DIAGNOSIS — B9789 Other viral agents as the cause of diseases classified elsewhere: Secondary | ICD-10-CM | POA: Insufficient documentation

## 2017-04-17 LAB — RAPID STREP SCREEN (MED CTR MEBANE ONLY): STREPTOCOCCUS, GROUP A SCREEN (DIRECT): NEGATIVE

## 2017-04-17 MED ORDER — CHLORHEXIDINE GLUCONATE 0.12 % MT SOLN: 15.0000 mL | Freq: Two times a day (BID) | OROMUCOSAL | 0 refills | Status: DC

## 2017-04-17 MED ORDER — PROMETHAZINE-DM 6.25-15 MG/5ML PO SYRP: 5.0000 mL | ORAL_SOLUTION | Freq: Four times a day (QID) | ORAL | 0 refills | Status: DC | PRN

## 2017-04-17 NOTE — ED Provider Notes (Signed)
[facility]-EMERGENCY DEPT Provider Note   CSN: [id number] Arrival date & time: 04/17/17  [id number]     History   Chief Complaint Chief Complaint  Patient presents with  . Sore Throat    HPI Diane Taylor is a 31 y.o. female.  HPI   31 year old female presenting with cold symptoms. Patient report for the past 2 days she has had subjective fever, chills, myalgias, nasal congestion, sneezing, coughing, and sore throat. Sore throat seems to bother her the most. She has decrease in appetite. She tries over-the-counter medication with minimal relief. She reports recent exposure to coworker who was diagnosed with strep throat. She was also exposed to a singular that was sick recently. She report 1 bouts of loose stool several days prior but none since. No report of nausea or vomiting, chest pain, shortness of breath, or abdominal cramping. She is not a smoker.  Past Medical History:  Diagnosis Date  . Gall bladder stones     There are no active problems to display for this patient.   History reviewed. No pertinent surgical history.  OB History    No data available       Home Medications    Prior to Admission medications   Medication Sig Start Date End Date Taking? Authorizing Provider  acetaminophen (TYLENOL) 500 MG tablet Take 500 mg by mouth every 6 (six) hours as needed for moderate pain.    [provider]  benzonatate (TESSALON PERLES) 100 MG capsule Take 1 capsule (100 mg total) by mouth 3 (three) times daily as needed for cough (cough). Patient not taking: Reported on 01/26/2017 10/24/16   Muthersbaugh, Dahlia ClientHannah, PA-C  ibuprofen (ADVIL,MOTRIN) 200 MG tablet Take 400 mg by mouth every 6 (six) hours as needed for moderate pain.    [provider]    Family History Family History  Problem Relation Age of Onset  . Diabetes Mother   . Hypertension Mother     Social History Social History  Substance Use Topics  . Smoking status: Never Smoker  .  Smokeless tobacco: Never Used  . Alcohol use Yes     Comment: occ     Allergies   Bromelains [pineapple extract] and Metoclopramide   Review of Systems Review of Systems  All other systems reviewed and are negative.    Physical Exam Updated Vital Signs BP 122/84 (BP Location: Right Arm)   Pulse 89   Temp 98.1 F (36.7 C) (Oral)   Resp 16   Ht 5\' 1"  (1.549 m)   Wt 82.1 kg (181 lb)   SpO2 97%   BMI 34.20 kg/m   Physical Exam  Constitutional: She appears well-developed and well-nourished. No distress.  Obese female nontoxic in appearance  HENT:  Head: Atraumatic.  Ears: TMs normal bilaterally Nose: Mild rhinorrhea  Throat: Uvula is midline no tonsillar enlargement or exudates, no trismus.   Eyes: Conjunctivae are normal.  Neck: Neck supple.  No nuchal rigidity  Cardiovascular: Normal rate and regular rhythm.   Pulmonary/Chest: Effort normal and breath sounds normal.  Abdominal: Soft. Bowel sounds are normal. She exhibits no distension. There is no tenderness.  No splenomegaly  Lymphadenopathy:    She has no cervical adenopathy.  Neurological: She is alert.  Skin: No rash noted.  Psychiatric: She has a normal mood and affect.  Nursing note and vitals reviewed.    ED Treatments / Results  Labs (all labs ordered are listed, but only abnormal results are displayed) Labs Reviewed  RAPID STREP  SCREEN (NOT AT [facility])  CULTURE, GROUP A STREP [facility])    EKG  EKG Interpretation None       Radiology No results found.  Procedures Procedures (including critical care time)  Medications Ordered in ED Medications - No data to display   Initial Impression / Assessment and Plan / ED Course  I have reviewed the triage vital signs and the nursing notes.  Pertinent labs & imaging results that were available during my care of the patient were reviewed by me and considered in my medical decision making (see chart for details).     BP 122/84 (BP Location: Right  Arm)   Pulse 89   Temp 98.1 F (36.7 C) (Oral)   Resp 16   Ht 5\' 1"  (1.549 m)   Wt 82.1 kg (181 lb)   SpO2 97%   BMI 34.20 kg/m    Final Clinical Impressions(s) / ED Diagnoses   Final diagnoses:  Viral URI with cough  Viral pharyngitis    New Prescriptions New Prescriptions   CHLORHEXIDINE (PERIDEX) 0.12 % SOLUTION    Use as directed 15 mLs in the mouth or throat 2 (two) times daily.   PROMETHAZINE-DEXTROMETHORPHAN (PROMETHAZINE-DM) 6.25-15 MG/5ML SYRUP    Take 5 mLs by mouth 4 (four) times daily as needed for cough.   7:27 AM Patient here with cold symptoms. She was exposed to another individual that has strep throat, therefore I will obtain a rapid strep test although her throat exam is unremarkable.  8:02 AM Strep test negative, throat culture sent. Will provide symptomatic treatment. Work note provided as requested. Return precaution discussed.   Fayrene Helper, PA-C 04/17/17 Mertie Moores    Azalia Bilis, MD 04/17/17 [phone]

## 2017-04-17 NOTE — ED Triage Notes (Signed)
PT states that she was at home and woke up two days ago feeling of sore throat, chills, fever taking tylenol for it, cough, diarrhea.

## 2017-04-19 LAB — CULTURE, GROUP A STREP (THRC)

## 2017-04-22 ENCOUNTER — Emergency Department (HOSPITAL_COMMUNITY): Payer: Self-pay

## 2017-04-22 ENCOUNTER — Emergency Department (HOSPITAL_COMMUNITY)
Admission: EM | Admit: 2017-04-22 | Discharge: 2017-04-22 | Disposition: A | Discharge status: Home / Self Care | Payer: Self-pay | Attending: Emergency Medicine | Admitting: Emergency Medicine

## 2017-04-22 ENCOUNTER — Encounter (HOSPITAL_COMMUNITY): Payer: Self-pay

## 2017-04-22 DIAGNOSIS — J069 Acute upper respiratory infection, unspecified: Secondary | ICD-10-CM | POA: Insufficient documentation

## 2017-04-22 DIAGNOSIS — R059 Cough, unspecified: Secondary | ICD-10-CM

## 2017-04-22 DIAGNOSIS — R05 Cough: Secondary | ICD-10-CM

## 2017-04-22 DIAGNOSIS — B9789 Other viral agents as the cause of diseases classified elsewhere: Secondary | ICD-10-CM | POA: Insufficient documentation

## 2017-04-22 MED ORDER — DOXYCYCLINE HYCLATE 100 MG PO CAPS: 100.0000 mg | ORAL_CAPSULE | Freq: Two times a day (BID) | ORAL | 0 refills | Status: AC

## 2017-04-22 MED ORDER — GUAIFENESIN-CODEINE 100-10 MG/5ML PO SYRP: 5.0000 mL | ORAL_SOLUTION | Freq: Three times a day (TID) | ORAL | 0 refills | Status: DC | PRN

## 2017-04-22 MED ORDER — BENZONATATE 100 MG PO CAPS: 100.0000 mg | ORAL_CAPSULE | Freq: Three times a day (TID) | ORAL | 0 refills | Status: DC

## 2017-04-22 NOTE — ED Notes (Signed)
Bed: [facility] Expected date:  Expected time:  Means of arrival:  Comments: 

## 2017-04-22 NOTE — ED Provider Notes (Signed)
[facility]-EMERGENCY DEPT Provider Note   CSN: [id number] Arrival date & time: 04/22/17  0501     History   Chief Complaint Chief Complaint  Patient presents with  . Cough    HPI Diane Taylor is a 31 y.o. female.  HPI  Patient presents to ED for evaluation of persistent cough for the past 4 days. She reports productive cough with yellow/green sputum. She has associated subjective fever, nasal congestion, sore throat, rhinorrhea. She reports some nausea from the cough as well as watery diarrhea but denies any vomiting, abdominal pain. Reports her boyfriend has similar symptoms. Of note, patient was seen in the emergency department 5 days ago and was told she had a viral URI. She states that she was not given any prescriptions and has tried every OTC medication to help with her cough with no improvement in symptoms. She denies any chest pain, hemoptysis, drooling, trismus, trouble breathing, history of DVT or PE, recent surgery, tick bite or exposure.  Past Medical History:  Diagnosis Date  . Gall bladder stones     There are no active problems to display for this patient.   History reviewed. No pertinent surgical history.  OB History    No data available       Home Medications    Prior to Admission medications   Medication Sig Start Date End Date Taking? Authorizing Provider  acetaminophen (TYLENOL) 500 MG tablet Take 500 mg by mouth every 6 (six) hours as needed for moderate pain.   Yes [provider]  ibuprofen (ADVIL,MOTRIN) 200 MG tablet Take 400 mg by mouth every 6 (six) hours as needed for moderate pain.   Yes [provider]  benzonatate (TESSALON) 100 MG capsule Take 1 capsule (100 mg total) by mouth every 8 (eight) hours. 04/22/17   Olof Marcil, PA-C  chlorhexidine (PERIDEX) 0.12 % solution Use as directed 15 mLs in the mouth or throat 2 (two) times daily. Patient not taking: Reported on 04/22/2017 04/17/17   Fayrene Helperran, Bowie, PA-C  doxycycline  (VIBRAMYCIN) 100 MG capsule Take 1 capsule (100 mg total) by mouth 2 (two) times daily. 04/22/17 04/29/17  Nike Southwell, PA-C  guaiFENesin-codeine (ROBITUSSIN AC) 100-10 MG/5ML syrup Take 5 mLs by mouth 3 (three) times daily as needed for cough. 04/22/17   Rosalin Buster, PA-C  promethazine-dextromethorphan (PROMETHAZINE-DM) 6.25-15 MG/5ML syrup Take 5 mLs by mouth 4 (four) times daily as needed for cough. Patient not taking: Reported on 04/22/2017 04/17/17   Fayrene Helperran, [place], PA-C    Family History Family History  Problem Relation Age of Onset  . Diabetes Mother   . Hypertension Mother     Social History Social History  Substance Use Topics  . Smoking status: Never Smoker  . Smokeless tobacco: Never Used  . Alcohol use Yes     Comment: occ     Allergies   Bromelains [pineapple extract] and Metoclopramide   Review of Systems Review of Systems  Constitutional: Positive for chills, fatigue and fever. Negative for appetite change.  HENT: Positive for rhinorrhea, sneezing and sore throat. Negative for ear pain.   Eyes: Negative for photophobia and visual disturbance.  Respiratory: Positive for cough. Negative for chest tightness, shortness of breath and wheezing.   Cardiovascular: Negative for chest pain and palpitations.  Gastrointestinal: Positive for diarrhea and nausea. Negative for abdominal pain, blood in stool, constipation and vomiting.  Genitourinary: Negative for dysuria, hematuria and urgency.  Musculoskeletal: Negative for myalgias.  Skin: Negative for rash.  Neurological: Negative  for dizziness, weakness and light-headedness.     Physical Exam Updated Vital Signs BP 135/82 (BP Location: Right Arm)   Pulse 100   Temp 98.5 F (36.9 C) (Oral)   Resp 16   LMP 04/08/2017   SpO2 100%   Physical Exam  Constitutional: She appears well-developed and well-nourished. No distress.  HENT:  Head: Normocephalic and atraumatic.  Nose: Nose normal.  Patient does not appear to be  in acute distress. No trismus or drooling present. No pooling of secretions. Patient is tolerating secretions and is not in respiratory distress. No neck pain or tenderness to palpation of the neck. Full active and passive range of motion of the neck. No evidence of RPA or PTA.  Eyes: Pupils are equal, round, and reactive to light. Conjunctivae and EOM are normal. Right eye exhibits no discharge. Left eye exhibits no discharge. No scleral icterus.  Neck: Normal range of motion. Neck supple.  Cardiovascular: Normal rate, regular rhythm, normal heart sounds and intact distal pulses.  Exam reveals no gallop and no friction rub.   No murmur heard. Pulmonary/Chest: Effort normal. No respiratory distress.  Coarse breath sounds R > L.. No respiratory distress noted. No accessory muscle use noted.  Abdominal: Soft. Bowel sounds are normal. She exhibits no distension. There is no tenderness. There is no guarding.  Musculoskeletal: Normal range of motion. She exhibits no edema.  Neurological: She is alert. She exhibits normal muscle tone. Coordination normal.  Skin: Skin is warm and dry. No rash noted.  Psychiatric: She has a normal mood and affect.  Nursing note and vitals reviewed.    ED Treatments / Results  Labs (all labs ordered are listed, but only abnormal results are displayed) Labs Reviewed - No data to display  EKG  EKG Interpretation None       Radiology Dg Chest 2 View  Result Date: 04/22/2017 CLINICAL DATA:  31 y/o F; worsening cough, congestion, and shortness of breath. Occasional smoker. EXAM: CHEST  2 VIEW COMPARISON:  10/24/2016 chest radiograph FINDINGS: Stable heart size and mediastinal contours are within normal limits. Both lungs are clear. The visualized skeletal structures are unremarkable. IMPRESSION: No acute pulmonary process identified. Electronically Signed   By: Mitzi HansenLance  Furusawa-Stratton M.D.   On: 04/22/2017 05:57    Procedures Procedures (including critical care  time)  Medications Ordered in ED Medications - No data to display   Initial Impression / Assessment and Plan / ED Course  I have reviewed the triage vital signs and the nursing notes.  Pertinent labs & imaging results that were available during my care of the patient were reviewed by me and considered in my medical decision making (see chart for details).      Patient presents to ED for evaluation of cough, sore throat, rhinorrhea, subjective fever for the past 4 days. She was seen here in the emergency room 5 days ago for similar symptoms. She reports no improvement in symptoms with OTC medications. She denies any chest pain, hemoptysis, drooling, trismus, history of DVT or PE. She denies any tick bite or exposure. On physical exam she has coarse breath sounds present in bilateral lung fields. She is afebrile at this time with no antipyretics. Satting at 100% on room air. She coughed several times in the room. She is not in respiratory distress. Throat culture from previous ED visit was negative. Chest x-ray done today was negative for acute pulmonary process. We will give antitussives and doxycycline to cover for flulike symptoms in  the summertime. Encourage patient to complete course of antibiotics for improvement in her symptoms. Patient appears stable for discharge at this time. Strict return precautions given.  Final Clinical Impressions(s) / ED Diagnoses   Final diagnoses:  Cough  Viral URI with cough    New Prescriptions New Prescriptions   BENZONATATE (TESSALON) 100 MG CAPSULE    Take 1 capsule (100 mg total) by mouth every 8 (eight) hours.   DOXYCYCLINE (VIBRAMYCIN) 100 MG CAPSULE    Take 1 capsule (100 mg total) by mouth 2 (two) times daily.   GUAIFENESIN-CODEINE (ROBITUSSIN AC) 100-10 MG/5ML SYRUP    Take 5 mLs by mouth 3 (three) times daily as needed for cough.     Dietrich Pates, PA-C 04/22/17 0701    Dione Booze, MD 04/22/17 [phone]

## 2017-04-22 NOTE — Discharge Instructions (Signed)
Please read attached information regarding your condition.  Take cough medication as indicated. Follow up with PCP for further evaluation. Return to ED for chest pain, trouble breathing, trouble swallowing, lip swelling, coughing up blood.

## 2017-04-22 NOTE — ED Triage Notes (Signed)
Pt is c/o cough  X 4 days with a productive cough that is yellow/green and foam. Pt states that she has not been able to sleep and OTC medications have not been effective in management. Pt reports that  D/t coughing chest, throat, and head hurts. Pt has rhonchi noted in bilateral lung fields.

## 2017-04-23 NOTE — ED Provider Notes (Signed)
[facility], patient unable to afford doxycycline. Prescription changed to Bactrim, #14 twice a day.   Mancel BaleWentz, Damarco Keysor, MD 04/23/17 [phone]

## 2017-10-12 ENCOUNTER — Emergency Department (HOSPITAL_COMMUNITY): Admission: EM | Admit: 2017-10-12 | Discharge: 2017-10-12 | Discharge status: Against Medical Advice | Payer: Self-pay

## 2018-02-28 ENCOUNTER — Encounter (HOSPITAL_COMMUNITY)
Admission: EM | Disposition: A | Discharge status: Home / Self Care | Payer: Self-pay | Source: Home / Self Care | Attending: Emergency Medicine

## 2018-02-28 ENCOUNTER — Other Ambulatory Visit: Payer: Self-pay

## 2018-02-28 ENCOUNTER — Emergency Department (HOSPITAL_COMMUNITY): Payer: Self-pay | Admitting: Anesthesiology

## 2018-02-28 ENCOUNTER — Encounter (HOSPITAL_COMMUNITY): Payer: Self-pay | Admitting: Emergency Medicine

## 2018-02-28 ENCOUNTER — Observation Stay (HOSPITAL_COMMUNITY)
Admission: EM | Admit: 2018-02-28 | Discharge: 2018-03-01 | Disposition: A | Discharge status: Home / Self Care | Payer: Self-pay | Attending: General Surgery | Admitting: General Surgery

## 2018-02-28 ENCOUNTER — Emergency Department (HOSPITAL_COMMUNITY): Payer: Self-pay

## 2018-02-28 DIAGNOSIS — R1011 Right upper quadrant pain: Secondary | ICD-10-CM

## 2018-02-28 DIAGNOSIS — Z888 Allergy status to other drugs, medicaments and biological substances status: Secondary | ICD-10-CM | POA: Insufficient documentation

## 2018-02-28 DIAGNOSIS — Z91018 Allergy to other foods: Secondary | ICD-10-CM | POA: Insufficient documentation

## 2018-02-28 DIAGNOSIS — Z8249 Family history of ischemic heart disease and other diseases of the circulatory system: Secondary | ICD-10-CM | POA: Insufficient documentation

## 2018-02-28 DIAGNOSIS — K8020 Calculus of gallbladder without cholecystitis without obstruction: Secondary | ICD-10-CM | POA: Diagnosis present

## 2018-02-28 DIAGNOSIS — Z833 Family history of diabetes mellitus: Secondary | ICD-10-CM | POA: Insufficient documentation

## 2018-02-28 DIAGNOSIS — K8010 Calculus of gallbladder with chronic cholecystitis without obstruction: Principal | ICD-10-CM | POA: Insufficient documentation

## 2018-02-28 DIAGNOSIS — Z419 Encounter for procedure for purposes other than remedying health state, unspecified: Secondary | ICD-10-CM

## 2018-02-28 HISTORY — PX: CHOLECYSTECTOMY: SHX55

## 2018-02-28 LAB — CBC
HEMATOCRIT: 42.1 % (ref 36.0–46.0)
HEMATOCRIT: 42.4 % (ref 36.0–46.0)
HEMOGLOBIN: 14 g/dL (ref 12.0–15.0)
HEMOGLOBIN: 14 g/dL (ref 12.0–15.0)
MCH: 27.5 pg (ref 26.0–34.0)
MCH: 27.2 pg (ref 26.0–34.0)
MCHC: 33.3 g/dL (ref 30.0–36.0)
MCHC: 33 g/dL (ref 30.0–36.0)
MCV: 82.5 fL (ref 78.0–100.0)
MCV: 82.5 fL (ref 78.0–100.0)
Platelets: 322 10*3/uL (ref 150–400)
Platelets: 312 10*3/uL (ref 150–400)
RBC: 5.1 MIL/uL (ref 3.87–5.11)
RBC: 5.14 MIL/uL — AB (ref 3.87–5.11)
RDW: 14 % (ref 11.5–15.5)
RDW: 14.1 % (ref 11.5–15.5)
WBC: 10.9 10*3/uL — ABNORMAL HIGH (ref 4.0–10.5)
WBC: 18.7 10*3/uL — ABNORMAL HIGH (ref 4.0–10.5)

## 2018-02-28 LAB — URINALYSIS, ROUTINE W REFLEX MICROSCOPIC
Bilirubin Urine: NEGATIVE
GLUCOSE, UA: NEGATIVE mg/dL
Hgb urine dipstick: NEGATIVE
KETONES UR: NEGATIVE mg/dL
Nitrite: NEGATIVE
PROTEIN: NEGATIVE mg/dL
Specific Gravity, Urine: 1.008 (ref 1.005–1.030)
pH: 5 (ref 5.0–8.0)

## 2018-02-28 LAB — COMPREHENSIVE METABOLIC PANEL
ALBUMIN: 3.9 g/dL (ref 3.5–5.0)
ALT: 32 U/L (ref 14–54)
ANION GAP: 11 (ref 5–15)
AST: 33 U/L (ref 15–41)
Alkaline Phosphatase: 78 U/L (ref 38–126)
BUN: 10 mg/dL (ref 6–20)
CALCIUM: 8.8 mg/dL — AB (ref 8.9–10.3)
CHLORIDE: 105 mmol/L (ref 101–111)
CO2: 21 mmol/L — AB (ref 22–32)
Creatinine, Ser: 0.73 mg/dL (ref 0.44–1.00)
GFR calc non Af Amer: >60 mL/min (ref >60)
Glucose, Bld: 117 mg/dL — ABNORMAL HIGH (ref 65–99)
POTASSIUM: 3.7 mmol/L (ref 3.5–5.1)
SODIUM: 137 mmol/L (ref 135–145)
Total Bilirubin: 0.5 mg/dL (ref 0.3–1.2)
Total Protein: 7.2 g/dL (ref 6.5–8.1)

## 2018-02-28 LAB — CREATININE, SERUM
CREATININE: 0.76 mg/dL (ref 0.44–1.00)
GFR calc Af Amer: >60 mL/min (ref >60)
GFR calc non Af Amer: >60 mL/min (ref >60)

## 2018-02-28 LAB — LIPASE, BLOOD: LIPASE: 34 U/L (ref 11–51)

## 2018-02-28 SURGERY — LAPAROSCOPIC CHOLECYSTECTOMY WITH INTRAOPERATIVE CHOLANGIOGRAM
Anesthesia: General

## 2018-02-28 MED ORDER — HYDROMORPHONE HCL 1 MG/ML IJ SOLN
INTRAMUSCULAR | Status: DC | PRN
  Administered 2018-02-28: 0.5 mg via INTRAVENOUS

## 2018-02-28 MED ORDER — MORPHINE SULFATE (PF) 2 MG/ML IV SOLN
1.0000 mg | INTRAVENOUS | Status: DC | PRN
  Administered 2018-02-28 (×2): 1 mg via INTRAVENOUS
  Filled 2018-02-28 (×2): qty 1

## 2018-02-28 MED ORDER — MORPHINE SULFATE (PF) 4 MG/ML IV SOLN
4.0000 mg | Freq: Once | INTRAVENOUS | Status: AC
  Administered 2018-02-28: 4 mg via INTRAVENOUS
  Filled 2018-02-28: qty 1

## 2018-02-28 MED ORDER — ENOXAPARIN SODIUM 40 MG/0.4ML ~~LOC~~ SOLN
40.0000 mg | SUBCUTANEOUS | Status: DC
  Administered 2018-03-01: 40 mg via SUBCUTANEOUS
  Filled 2018-02-28: qty 0.4

## 2018-02-28 MED ORDER — CHLORHEXIDINE GLUCONATE CLOTH 2 % EX PADS: 6.0000 | MEDICATED_PAD | Freq: Once | CUTANEOUS | Status: DC

## 2018-02-28 MED ORDER — METOPROLOL TARTRATE 5 MG/5ML IV SOLN: 5.0000 mg | Freq: Four times a day (QID) | INTRAVENOUS | Status: DC | PRN

## 2018-02-28 MED ORDER — ACETAMINOPHEN 325 MG PO TABS
650.0000 mg | ORAL_TABLET | Freq: Four times a day (QID) | ORAL | Status: DC | PRN
  Administered 2018-03-01: 650 mg via ORAL
  Filled 2018-02-28: qty 2

## 2018-02-28 MED ORDER — LACTATED RINGERS IV SOLN
INTRAVENOUS | Status: AC | PRN
  Administered 2018-02-28: 1000 mL via INTRAVENOUS

## 2018-02-28 MED ORDER — FENTANYL CITRATE (PF) 100 MCG/2ML IJ SOLN
INTRAMUSCULAR | Status: DC | PRN
  Administered 2018-02-28 (×2): 100 ug via INTRAVENOUS
  Administered 2018-02-28: 50 ug via INTRAVENOUS

## 2018-02-28 MED ORDER — ONDANSETRON HCL 4 MG/2ML IJ SOLN
INTRAMUSCULAR | Status: AC
  Filled 2018-02-28: qty 2

## 2018-02-28 MED ORDER — OXYCODONE HCL 5 MG PO TABS
5.0000 mg | ORAL_TABLET | ORAL | Status: DC | PRN
  Administered 2018-02-28 – 2018-03-01 (×3): 5 mg via ORAL
  Filled 2018-02-28 (×3): qty 1

## 2018-02-28 MED ORDER — ONDANSETRON HCL 4 MG/2ML IJ SOLN
4.0000 mg | Freq: Once | INTRAMUSCULAR | Status: AC
  Administered 2018-02-28: 4 mg via INTRAVENOUS
  Filled 2018-02-28: qty 2

## 2018-02-28 MED ORDER — LACTATED RINGERS IV SOLN: INTRAVENOUS | Status: DC

## 2018-02-28 MED ORDER — ROCURONIUM BROMIDE 100 MG/10ML IV SOLN
INTRAVENOUS | Status: DC | PRN
  Administered 2018-02-28: 50 mg via INTRAVENOUS

## 2018-02-28 MED ORDER — BUPIVACAINE-EPINEPHRINE (PF) 0.25% -1:200000 IJ SOLN
INTRAMUSCULAR | Status: DC | PRN
  Administered 2018-02-28: 30 mL via PERINEURAL

## 2018-02-28 MED ORDER — ONDANSETRON 4 MG PO TBDP: 4.0000 mg | ORAL_TABLET | Freq: Four times a day (QID) | ORAL | Status: DC | PRN

## 2018-02-28 MED ORDER — MIDAZOLAM HCL 2 MG/2ML IJ SOLN
INTRAMUSCULAR | Status: AC
  Filled 2018-02-28: qty 2

## 2018-02-28 MED ORDER — ACETAMINOPHEN 650 MG RE SUPP: 650.0000 mg | Freq: Four times a day (QID) | RECTAL | Status: DC | PRN

## 2018-02-28 MED ORDER — METHOCARBAMOL 500 MG PO TABS: 500.0000 mg | ORAL_TABLET | Freq: Four times a day (QID) | ORAL | Status: DC | PRN

## 2018-02-28 MED ORDER — SIMETHICONE 80 MG PO CHEW
40.0000 mg | CHEWABLE_TABLET | Freq: Four times a day (QID) | ORAL | Status: DC | PRN
  Administered 2018-02-28: 40 mg via ORAL
  Filled 2018-02-28: qty 1

## 2018-02-28 MED ORDER — ONDANSETRON HCL 4 MG/2ML IJ SOLN: 4.0000 mg | Freq: Four times a day (QID) | INTRAMUSCULAR | Status: DC | PRN

## 2018-02-28 MED ORDER — SUGAMMADEX SODIUM 200 MG/2ML IV SOLN
INTRAVENOUS | Status: AC
  Filled 2018-02-28: qty 2

## 2018-02-28 MED ORDER — MEPERIDINE HCL 50 MG/ML IJ SOLN: 6.2500 mg | INTRAMUSCULAR | Status: DC | PRN

## 2018-02-28 MED ORDER — HYDRALAZINE HCL 20 MG/ML IJ SOLN: 10.0000 mg | INTRAMUSCULAR | Status: DC | PRN

## 2018-02-28 MED ORDER — SODIUM CHLORIDE 0.45 % IV SOLN
INTRAVENOUS | Status: DC
  Administered 2018-02-28 – 2018-03-01 (×2): via INTRAVENOUS

## 2018-02-28 MED ORDER — PROPOFOL 10 MG/ML IV BOLUS
INTRAVENOUS | Status: AC
  Filled 2018-02-28: qty 20

## 2018-02-28 MED ORDER — LACTATED RINGERS IV SOLN
INTRAVENOUS | Status: DC | PRN
  Administered 2018-02-28: 1000 mL
  Administered 2018-02-28: 15:00:00 via INTRAVENOUS

## 2018-02-28 MED ORDER — FENTANYL CITRATE (PF) 250 MCG/5ML IJ SOLN
INTRAMUSCULAR | Status: AC
  Filled 2018-02-28: qty 5

## 2018-02-28 MED ORDER — FENTANYL CITRATE (PF) 100 MCG/2ML IJ SOLN
INTRAMUSCULAR | Status: AC
  Filled 2018-02-28: qty 4

## 2018-02-28 MED ORDER — DEXAMETHASONE SODIUM PHOSPHATE 10 MG/ML IJ SOLN
INTRAMUSCULAR | Status: DC | PRN
  Administered 2018-02-28: 10 mg via INTRAVENOUS

## 2018-02-28 MED ORDER — LABETALOL HCL 5 MG/ML IV SOLN
5.0000 mg | INTRAVENOUS | Status: DC | PRN
  Administered 2018-02-28: 5 mg via INTRAVENOUS

## 2018-02-28 MED ORDER — SCOPOLAMINE 1 MG/3DAYS TD PT72
MEDICATED_PATCH | TRANSDERMAL | Status: AC
  Administered 2018-02-28: 1.5 mg via OTIC
  Filled 2018-02-28: qty 1

## 2018-02-28 MED ORDER — LABETALOL HCL 5 MG/ML IV SOLN
INTRAVENOUS | Status: AC
  Filled 2018-02-28: qty 4

## 2018-02-28 MED ORDER — ONDANSETRON HCL 4 MG/2ML IJ SOLN
INTRAMUSCULAR | Status: DC | PRN
  Administered 2018-02-28: 4 mg via INTRAVENOUS

## 2018-02-28 MED ORDER — CEFAZOLIN SODIUM-DEXTROSE 2-4 GM/100ML-% IV SOLN
2.0000 g | INTRAVENOUS | Status: AC
  Administered 2018-02-28: 2 g via INTRAVENOUS
  Filled 2018-02-28: qty 100

## 2018-02-28 MED ORDER — FENTANYL CITRATE (PF) 100 MCG/2ML IJ SOLN
25.0000 ug | INTRAMUSCULAR | Status: DC | PRN
  Administered 2018-02-28 (×2): 50 ug via INTRAVENOUS

## 2018-02-28 MED ORDER — HYDROMORPHONE HCL 2 MG/ML IJ SOLN
INTRAMUSCULAR | Status: AC
  Filled 2018-02-28: qty 1

## 2018-02-28 MED ORDER — LIDOCAINE HCL (CARDIAC) PF 100 MG/5ML IV SOSY
PREFILLED_SYRINGE | INTRAVENOUS | Status: DC | PRN
  Administered 2018-02-28: 100 mg via INTRAVENOUS

## 2018-02-28 MED ORDER — DIPHENHYDRAMINE HCL 50 MG/ML IJ SOLN: 25.0000 mg | Freq: Four times a day (QID) | INTRAMUSCULAR | Status: DC | PRN

## 2018-02-28 MED ORDER — MIDAZOLAM HCL 5 MG/5ML IJ SOLN
INTRAMUSCULAR | Status: DC | PRN
  Administered 2018-02-28: 2 mg via INTRAVENOUS

## 2018-02-28 MED ORDER — SCOPOLAMINE 1 MG/3DAYS TD PT72: 1.0000 | MEDICATED_PATCH | TRANSDERMAL | Status: DC

## 2018-02-28 MED ORDER — SUGAMMADEX SODIUM 200 MG/2ML IV SOLN
INTRAVENOUS | Status: DC | PRN
  Administered 2018-02-28: 200 mg via INTRAVENOUS

## 2018-02-28 MED ORDER — IOPAMIDOL (ISOVUE-300) INJECTION 61%
INTRAVENOUS | Status: AC
  Filled 2018-02-28: qty 50

## 2018-02-28 MED ORDER — DIPHENHYDRAMINE HCL 25 MG PO CAPS: 25.0000 mg | ORAL_CAPSULE | Freq: Four times a day (QID) | ORAL | Status: DC | PRN

## 2018-02-28 MED ORDER — BUPIVACAINE-EPINEPHRINE (PF) 0.25% -1:200000 IJ SOLN
INTRAMUSCULAR | Status: AC
  Filled 2018-02-28: qty 30

## 2018-02-28 MED ORDER — PROPOFOL 10 MG/ML IV BOLUS
INTRAVENOUS | Status: DC | PRN
  Administered 2018-02-28: 150 mg via INTRAVENOUS

## 2018-02-28 MED ORDER — SODIUM CHLORIDE 0.9 % IV BOLUS
1000.0000 mL | Freq: Once | INTRAVENOUS | Status: AC
  Administered 2018-02-28: 1000 mL via INTRAVENOUS

## 2018-02-28 SURGICAL SUPPLY — 36 items
ADH SKN CLS APL DERMABOND .7 (GAUZE/BANDAGES/DRESSINGS) ×1
APPLIER CLIP 5 13 M/L LIGAMAX5 (MISCELLANEOUS)
APR CLP MED LRG 5 ANG JAW (MISCELLANEOUS)
BAG SPEC RTRVL 10 TROC 200 (ENDOMECHANICALS) ×1
CABLE HIGH FREQUENCY MONO STRZ (ELECTRODE) ×2 IMPLANT
CHLORAPREP W/TINT 26ML (MISCELLANEOUS) ×2 IMPLANT
CLIP APPLIE 5 13 M/L LIGAMAX5 (MISCELLANEOUS) IMPLANT
CLIP VESOLOCK MED LG 6/CT (CLIP) ×4 IMPLANT
COVER MAYO STAND STRL (DRAPES) ×1 IMPLANT
COVER TRANSDUCER ULTRASND (DRAPES) ×2 IMPLANT
DECANTER SPIKE VIAL GLASS SM (MISCELLANEOUS) ×2 IMPLANT
DERMABOND ADVANCED (GAUZE/BANDAGES/DRESSINGS) ×1
DERMABOND ADVANCED .7 DNX12 (GAUZE/BANDAGES/DRESSINGS) ×1 IMPLANT
DEVICE TROCAR PUNCTURE CLOSURE (ENDOMECHANICALS) IMPLANT
DRAPE C-ARM 42X120 X-RAY (DRAPES) ×1 IMPLANT
ELECT REM PT RETURN 15FT ADLT (MISCELLANEOUS) ×2 IMPLANT
GAUZE SPONGE 2X2 8PLY STRL LF (GAUZE/BANDAGES/DRESSINGS) ×1 IMPLANT
GLOVE BIO SURGEON STRL SZ7.5 (GLOVE) ×2 IMPLANT
GOWN STRL REUS W/TWL XL LVL3 (GOWN DISPOSABLE) ×6 IMPLANT
KIT BASIN OR (CUSTOM PROCEDURE TRAY) ×2 IMPLANT
NDL INSUFFLATION 14GA 120MM (NEEDLE) ×1 IMPLANT
NEEDLE INSUFFLATION 14GA 120MM (NEEDLE) ×2 IMPLANT
POUCH RETRIEVAL ECOSAC 10 (ENDOMECHANICALS) IMPLANT
POUCH RETRIEVAL ECOSAC 10MM (ENDOMECHANICALS) ×1
SCISSORS LAP 5X35 DISP (ENDOMECHANICALS) ×2 IMPLANT
SET CHOLANGIOGRAPH MIX (MISCELLANEOUS) ×1 IMPLANT
SET IRRIG TUBING LAPAROSCOPIC (IRRIGATION / IRRIGATOR) ×2 IMPLANT
SLEEVE XCEL OPT CAN 5 100 (ENDOMECHANICALS) ×3 IMPLANT
SPONGE GAUZE 2X2 STER 10/PKG (GAUZE/BANDAGES/DRESSINGS) ×1
SUT MNCRL AB 4-0 PS2 18 (SUTURE) ×3 IMPLANT
SUT VICRYL 0 UR6 27IN ABS (SUTURE) ×1 IMPLANT
TOWEL OR 17X26 10 PK STRL BLUE (TOWEL DISPOSABLE) ×2 IMPLANT
TRAY LAPAROSCOPIC (CUSTOM PROCEDURE TRAY) ×2 IMPLANT
TROCAR BLADELESS OPT 5 100 (ENDOMECHANICALS) ×2 IMPLANT
TROCAR XCEL NON-BLD 11X100MML (ENDOMECHANICALS) ×2 IMPLANT
TUBING INSUF HEATED (TUBING) ×2 IMPLANT

## 2018-02-28 NOTE — Anesthesia Preprocedure Evaluation (Signed)
Anesthesia Evaluation  Patient identified by MRN, date of birth, ID band Patient awake    Reviewed: Allergy & Precautions, NPO status , Patient's Chart, lab work & pertinent test results  Airway Mallampati: II  TM Distance: >3 FB Neck ROM: Full    Dental no notable dental hx.    Pulmonary neg pulmonary ROS,    Pulmonary exam normal breath sounds clear to auscultation       Cardiovascular negative cardio ROS Normal cardiovascular exam Rhythm:Regular Rate:Normal     Neuro/Psych negative neurological ROS  negative psych ROS   GI/Hepatic negative GI ROS, Neg liver ROS,   Endo/Other  negative endocrine ROS  Renal/GU negative Renal ROS  negative genitourinary   Musculoskeletal negative musculoskeletal ROS (+)   Abdominal   Peds negative pediatric ROS (+)  Hematology negative hematology ROS (+)   Anesthesia Other Findings   Reproductive/Obstetrics negative OB ROS                             Anesthesia Physical Anesthesia Plan  ASA: II  Anesthesia Plan: General   Post-op Pain Management:    Induction: Intravenous  PONV Risk Score and Plan: 4 or greater and Ondansetron, Dexamethasone, Midazolam and Scopolamine patch - Pre-op  Airway Management Planned: Oral ETT  Additional Equipment:   Intra-op Plan:   Post-operative Plan: Extubation in OR  Informed Consent: I have reviewed the patients History and Physical, chart, labs and discussed the procedure including the risks, benefits and alternatives for the proposed anesthesia with the patient or authorized representative who has indicated his/her understanding and acceptance.   Dental advisory given  Plan Discussed with: CRNA  Anesthesia Plan Comments:         Anesthesia Quick Evaluation  

## 2018-02-28 NOTE — Anesthesia Postprocedure Evaluation (Signed)
Anesthesia Post Note  Patient: Consulting civil engineerMegan Carmella Taylor  Procedure(s) Performed: LAPAROSCOPIC CHOLECYSTECTOMY (N/A )     Patient location during evaluation: PACU Anesthesia Type: General Level of consciousness: awake and alert Pain management: pain level controlled Vital Signs Assessment: post-procedure vital signs reviewed and stable Respiratory status: spontaneous breathing, nonlabored ventilation, respiratory function stable and patient connected to nasal cannula oxygen Cardiovascular status: blood pressure returned to baseline and stable Postop Assessment: no apparent nausea or vomiting Anesthetic complications: no    Last Vitals:  Vitals:   02/28/18 1700 02/28/18 1723  BP: (!) 141/83 (!) 165/96  Pulse: 66 80  Resp: 20   Temp:  36.8 C  SpO2: 93% 92%    Last Pain:  Vitals:   02/28/18 1723  TempSrc: Oral  PainSc:                  Phillips Groutarignan, Danalee Flath

## 2018-02-28 NOTE — Discharge Instructions (Signed)

## 2018-02-28 NOTE — ED Provider Notes (Addendum)
[facility]-EMERGENCY DEPT Provider Note   CSN: [id number] Arrival date & time: 02/28/18  5284     History   Chief Complaint Chief Complaint  Patient presents with  . Abdominal Pain    HPI Diane Taylor is a 32 y.o. female.  The history is provided by the patient and medical records.  Abdominal Pain   This is a recurrent problem. The current episode started more than 1 week ago. The problem occurs constantly. The problem has been gradually worsening. The pain is associated with eating. The pain is located in the RUQ and epigastric region. The quality of the pain is aching, dull and sharp. The pain is at a severity of 8/10. The pain is severe. Associated symptoms include nausea and vomiting. Pertinent negatives include fever, diarrhea, constipation, dysuria, frequency and hematuria. The symptoms are aggravated by palpation and eating. Nothing relieves the symptoms. Her past medical history is significant for gallstones.    Past Medical History:  Diagnosis Date  . Gall bladder stones     There are no active problems to display for this patient.   History reviewed. No pertinent surgical history.   OB History   None      Home Medications    Prior to Admission medications   Medication Sig Start Date End Date Taking? Authorizing Provider  acetaminophen (TYLENOL) 500 MG tablet Take 500 mg by mouth every 6 (six) hours as needed for moderate pain.   Yes [provider]  ibuprofen (ADVIL,MOTRIN) 200 MG tablet Take 400 mg by mouth every 6 (six) hours as needed for moderate pain.   Yes [provider]  benzonatate (TESSALON) 100 MG capsule Take 1 capsule (100 mg total) by mouth every 8 (eight) hours. Patient not taking: Reported on 02/28/2018 04/22/17   Dietrich Pates, PA-C  chlorhexidine (PERIDEX) 0.12 % solution Use as directed 15 mLs in the mouth or throat 2 (two) times daily. Patient not taking: Reported on 04/22/2017 04/17/17   Fayrene Helper, PA-C  guaiFENesin-codeine [facility]) 100-10 MG/5ML syrup Take 5 mLs by mouth 3 (three) times daily as needed for cough. Patient not taking: Reported on 02/28/2018 04/22/17   Dietrich Pates, PA-C  promethazine-dextromethorphan (PROMETHAZINE-DM) 6.25-15 MG/5ML syrup Take 5 mLs by mouth 4 (four) times daily as needed for cough. Patient not taking: Reported on 04/22/2017 04/17/17   Fayrene Helper, PA-C    Family History Family History  Problem Relation Age of Onset  . Diabetes Mother   . Hypertension Mother     Social History Social History   Tobacco Use  . Smoking status: Never Smoker  . Smokeless tobacco: Never Used  Substance Use Topics  . Alcohol use: Yes    Comment: occ  . Drug use: No     Allergies   Bromelains [pineapple extract] and Metoclopramide   Review of Systems Review of Systems  Constitutional: Positive for chills. Negative for diaphoresis, fatigue and fever.  HENT: Negative for congestion.   Respiratory: Negative for cough, chest tightness and shortness of breath.   Cardiovascular: Negative for chest pain and palpitations.  Gastrointestinal: Positive for abdominal pain, nausea and vomiting. Negative for constipation and diarrhea.  Genitourinary: Negative for dysuria, flank pain, frequency and hematuria.  Musculoskeletal: Negative for back pain, neck pain and neck stiffness.  Neurological: Negative for light-headedness and numbness.  Psychiatric/Behavioral: Negative for agitation.  All other systems reviewed and are negative.    Physical Exam Updated Vital Signs BP 124/83 (BP Location: Left Arm)  Pulse 74   Temp 97.7 F (36.5 C) (Oral)   Resp 18   Ht 5\' 2"  (1.575 m)   Wt 79.4 kg (175 lb)   SpO2 95%   BMI 32.01 kg/m   Physical Exam  Constitutional: She appears well-developed and well-nourished.  Non-toxic appearance. She does not appear ill. No distress.  HENT:  Head: Normocephalic and atraumatic.  Mouth/Throat: Oropharynx is clear and  moist. No oropharyngeal exudate.  Eyes: Pupils are equal, round, and reactive to light. Conjunctivae and EOM are normal.  Neck: Normal range of motion. Neck supple.  Cardiovascular: Normal rate and regular rhythm.  No murmur heard. Pulmonary/Chest: Effort normal and breath sounds normal. No respiratory distress. She has no wheezes. She exhibits no tenderness.  Abdominal: Soft. Normal appearance and bowel sounds are normal. She exhibits no distension. There is tenderness in the right upper quadrant and epigastric area. There is no rigidity and no CVA tenderness.    Musculoskeletal: She exhibits no edema or tenderness.  Neurological: She is alert. No sensory deficit. She exhibits normal muscle tone.  Skin: Skin is warm and dry. Capillary refill takes less than 2 seconds. No rash noted. She is not diaphoretic. No erythema.  Psychiatric: She has a normal mood and affect.  Nursing note and vitals reviewed.    ED Treatments / Results  Labs (all labs ordered are listed, but only abnormal results are displayed) Labs Reviewed  COMPREHENSIVE METABOLIC PANEL - Abnormal; Notable for the following components:      Result Value   CO2 21 (*)    Glucose, Bld 117 (*)    Calcium 8.8 (*)    All other components within normal limits  CBC - Abnormal; Notable for the following components:   WBC 10.9 (*)    All other components within normal limits  URINALYSIS, ROUTINE W REFLEX MICROSCOPIC - Abnormal; Notable for the following components:   Color, Urine STRAW (*)    APPearance HAZY (*)    Leukocytes, UA SMALL (*)    Bacteria, UA RARE (*)    All other components within normal limits  LIPASE, BLOOD  HIV ANTIBODY (ROUTINE TESTING)  CBC  CREATININE, SERUM  SURGICAL PATHOLOGY    EKG None  Radiology US Abdomen Limited Ruq  Result Date: 02/28/2018 CLINICAL DATA:  Right upper quadrant pain EXAM: ULTRASOUND ABDOMEN LIMITED RIGHT UPPER QUADRANT COMPARISON:  CT abdomen and pelvis Feb 09, 2008 FINDINGS:  Gallbladder: Within the gallbladder, there is either one large or multiple adherent echogenic foci which move and shadow consistent with cholelithiasis. This area of cholelithiasis measures 3.2 cm in length. There is no gallbladder wall thickening or pericholecystic fluid. No sonographic Murphy sign noted by sonographer. Common bile duct: Diameter: 3 mm. No intrahepatic or extrahepatic biliary duct dilatation. Liver: No focal lesion identified. Liver echogenicity overall is increased. Portal vein is patent on color Doppler imaging with normal direction of blood flow towards the liver. IMPRESSION: 1. Cholelithiasis. No gallbladder wall thickening or pericholecystic fluid. 2. Diffuse increase in liver echogenicity, a finding indicative of hepatic steatosis. While no focal liver lesions are evident on this study, it must be cautioned that the sensitivity of ultrasound for detection of focal liver lesions is diminished in this circumstance. Electronically Signed   By: Bretta Bang III M.D.   On: 02/28/2018 10:32    Procedures Procedures (including critical care time)  Medications Ordered in ED Medications  enoxaparin (LOVENOX) injection 40 mg (has no administration in time range)  0.45 % sodium  chloride infusion ( Intravenous New Bag/Given 02/28/18 1751)  acetaminophen (TYLENOL) tablet 650 mg (has no administration in time range)    Or  acetaminophen (TYLENOL) suppository 650 mg (has no administration in time range)  oxyCODONE (Oxy IR/ROXICODONE) immediate release tablet 5 mg (has no administration in time range)  morphine 2 MG/ML injection 1 mg (1 mg Intravenous Given 02/28/18 1749)  diphenhydrAMINE (BENADRYL) capsule 25 mg (has no administration in time range)    Or  diphenhydrAMINE (BENADRYL) injection 25 mg (has no administration in time range)  ondansetron (ZOFRAN-ODT) disintegrating tablet 4 mg (has no administration in time range)    Or  ondansetron (ZOFRAN) injection 4 mg (has no  administration in time range)  simethicone (MYLICON) chewable tablet 40 mg (has no administration in time range)  hydrALAZINE (APRESOLINE) injection 10 mg (has no administration in time range)  metoprolol tartrate (LOPRESSOR) injection 5 mg (has no administration in time range)  methocarbamol (ROBAXIN) tablet 500 mg (has no administration in time range)  fentaNYL (SUBLIMAZE) 100 MCG/2ML injection (has no administration in time range)  labetalol (NORMODYNE,TRANDATE) 5 MG/ML injection (has no administration in time range)  morphine 4 MG/ML injection 4 mg (4 mg Intravenous Given 02/28/18 0922)  ondansetron (ZOFRAN) injection 4 mg (4 mg Intravenous Given 02/28/18 0910)  sodium chloride 0.9 % bolus 1,000 mL (0 mLs Intravenous Stopped 02/28/18 1033)  ceFAZolin (ANCEF) IVPB 2g/100 mL premix ( Intravenous Anesthesia Volume Adjustment 02/28/18 1616)  scopolamine (TRANSDERM-SCOP) 1 MG/3DAYS (1.5 mg Left EAR Patch Applied 02/28/18 1451)  lactated ringers infusion (1,000 mLs Intravenous New Bag/Given 02/28/18 1526)     Initial Impression / Assessment and Plan / ED Course  I have reviewed the triage vital signs and the nursing notes.  Pertinent labs & imaging results that were available during my care of the patient were reviewed by me and considered in my medical decision making (see chart for details).     Darrel HooverMegan Carmella Guier is a 32 y.o. female with a past medical history significant for known large gallstone who presents with upper abdominal pain.  Patient reports that she is having severe pain in her epigastrium right upper quadrant consistent with her gallbladder pain.  She reports that she was offered outpatient surgery in the past but cannot afford it.  I told her that they would wait until it was emergent.  She says that she has been having these pains on and off for the last few years while dealing with it but over the last 6 days it is persisted.  She reports dyspnea on 8 out of 10 in severity and 10  out of 10 when she eats.  Reports nausea, vomiting, chills, decreased p.o. intake.  She denies any dysuria.  She does report some mild diarrhea.  She has taken Tylenol with minimal pain relief.  She is concerned that it has not improved after several days which he normally does.  On exam, patient has tenderness in her epigastrium right upper quadrant.  Lungs are clear and chest is nontender.  Abdomen is nondistended.  No lower abdominal tenderness, doubt appendicitis.  Patient will have laboratory testing and ultrasound to evaluate the gallbladder and abdomen.  Patient will be on pain medicine, nausea medicine and fluids.  Patient made n.p.o.    Anticipate reassessment after work-up.     11:17 AM Patient had improved symptoms after medications.  Patient's work-up showed no nitrites.  Given lack of urinary symptoms doubt UTI.  Lipase not elevated.  LFTs were  within normal limits and kidney function nonelevated.  Gallbladder ultrasound showed the large cholelithiasis that was 3.2 cm in length.  There was no evidence of acute cholecystitis seen.  There was hepatic cytosis seen on the ultrasound.  Given patient's symptoms and the confirmed stone, I am concerned about symptomatic cholelithiasis.  As the patient's symptoms are much longer in duration and they have in the past, surgery will be called for recommendations on symptomatic cholelithiasis management.  Anticipate following up on surgery recognitions.  Surgery saw the patient will take her to the OR.  Patient to be admitted following cholecystectomy.    Final Clinical Impressions(s) / ED Diagnoses   Final diagnoses:  RUQ abdominal pain    ED Discharge Orders    None       Tegeler, Canary Brim, MD 02/28/18 1809   Clinical Impression: 1. RUQ abdominal pain   2. Surgery, elective     Disposition: Admit  This note was prepared with assistance of Dragon voice recognition software. Occasional wrong-word or sound-a-like  substitutions may have occurred due to the inherent limitations of voice recognition software.     Tegeler, Canary Brim, MD 02/28/18 [phone]

## 2018-02-28 NOTE — Op Note (Signed)
02/28/2018  3:50 PM  PATIENT:  Diane Taylor  32 y.o. female  PRE-OPERATIVE DIAGNOSIS:  symptomatic cholelithiasis  POST-OPERATIVE DIAGNOSIS:  symptomatic cholelithiasis  PROCEDURE:  Procedure(s): LAPAROSCOPIC CHOLECYSTECTOMY (N/A)  SURGEON:  Surgeon(s) and Role:    * Axel Filleramirez, Waverly Chavarria, MD - Primary   ASSISTANTS: Bailey MechLiz Simaan, PA-C   ANESTHESIA:   Local Dorena Bodo/GETA  EBL:  minimal   BLOOD ADMINISTERED:none  DRAINS: none   LOCAL MEDICATIONS USED:  BUPIVICAINE   SPECIMEN:  Source of Specimen:  gallbladder  DISPOSITION OF SPECIMEN:  PATHOLOGY  COUNTS:  YES  TOURNIQUET:  * No tourniquets in log *  DICTATION: .Dragon Dictation  The patient was taken to the operating and placed in the supine position with bilateral SCDs in place. The patient was prepped and draped in the usual sterile fashion. A time out was called and all facts were verified. A pneumoperitoneum was obtained via A Veress needle technique to a pressure of 14mm of mercury.  A 5mm trochar was then placed in the right upper quadrant under visualization, and there were no injuries to any abdominal organs. A 11 mm port was then placed in the umbilical region after infiltrating with local anesthesia under direct visualization. A second and third epigastric port and right lower quadrant port placement under direct visualization, respectively. The liver was very fatty and large. The gallbladder was identified and retracted, the peritoneum was then sharply dissected from the gallbladder and this dissection was carried down to Calot's triangle. The gallbladder was identified and stripped away circumferentially and seen going into the gallbladder 360, the critical angle was obtained.  The cystic duct appeared to be short. 2 clips were placed proximally one distally and the cystic duct transected. The cystic artery was identified and 2 clips placed proximally and one distally and transected. We then proceeded to remove the  gallbladder off the hepatic fossa with Bovie cautery. A retrieval bag was then placed in the abdomen and gallbladder placed in the bag. The hepatic fossa was then reexamined and hemostasis was achieved with Bovie cautery and was excellent at the end of the case. The subhepatic fossa and perihepatic fossa was then irrigated until the effluent was clear.  The gallbladder and bag were removed from the abdominal cavity. The 11 mm trocar fascia was reapproximated with the Endo Close #1 Vicryl. The pneumoperitoneum was evacuated and all trochars removed under direct visulalization. The skin was then closed with 4-0 Monocryl and the skin dressed with Dermabond. The patient was awaken from general anesthesia and taken to the recovery room in stable condition.   PLAN OF CARE: Admit for overnight observation  PATIENT DISPOSITION:  PACU - hemodynamically stable.   Delay start of Pharmacological VTE agent (>24hrs) due to surgical blood loss or risk of bleeding: not applicable

## 2018-02-28 NOTE — Anesthesia Procedure Notes (Signed)
Procedure Name: Intubation Date/Time: 02/28/2018 3:13 PM Performed by: Thornell MuleStubblefield, Shams Fill G, CRNA Pre-anesthesia Checklist: Patient identified, Emergency Drugs available, Suction available and Patient being monitored Patient Re-evaluated:Patient Re-evaluated prior to induction Oxygen Delivery Method: Circle system utilized Preoxygenation: Pre-oxygenation with 100% oxygen Induction Type: IV induction Ventilation: Mask ventilation without difficulty Laryngoscope Size: Miller and 3 Grade View: Grade I Tube type: Oral Tube size: 7.5 mm Number of attempts: 1 Airway Equipment and Method: Stylet and Oral airway Placement Confirmation: ETT inserted through vocal cords under direct vision,  positive ETCO2 and breath sounds checked- equal and bilateral Secured at: 20 cm Tube secured with: Tape Dental Injury: Teeth and Oropharynx as per pre-operative assessment

## 2018-02-28 NOTE — H&P (Signed)
[facility] Admission Note  Diane Taylor [facility] 10-Sep-1986  [id number].    Requesting MD: Sherry Ruffing, MD Chief Complaint/Reason for Consult: Symptomatic cholelithiasis  HPI:  Diane Taylor is a 32 year old female with a known large gallstone who presented to [facility] emergency department with 6 days of abdominal pain.  Patient has previously managed her gallbladder symptoms with diet however this episode of pain has persisted.  She has had intermittent symptoms for 5 years, and the attacks are becoming more frequent. Pain is described as epigastric and right upper quadrant pain that is both dull and sharp in nature.  It is nonradiating.  Pain exacerbated by oral intake.  She denies trying to take anything for the pain.  Denies alleviating symptoms.  Associated symptoms include nausea, vomiting, diarrhea.  The patient is a never smoker.  Allergic to bromelains (GI upset) and metoclopramide (agitation). Prior abdominal surgery includes laparoscopic fallopian tube removal 11/2015.  ED work-up shows a possible mild leukocytosis of 10,900, LFTs and lipase are within normal limits.  Right upper quadrant ultrasound was performed and reveals a 3.2 cm gallstone in the gallbladder with no gallbladder wall thickening or pericholecystic fluid.  Negative sonographic Murphy sign.    Despite pain medication the patient continues to have pain in the emergency department.  General surgery was asked to evaluate for possible laparoscopic cholecystectomy.  ROS: Review of Systems  Constitutional: Negative.   HENT: Negative.   Eyes: Negative.   Respiratory: Negative.   Cardiovascular: Negative.   Gastrointestinal: Positive for abdominal pain, diarrhea, nausea and vomiting. Negative for constipation.  Genitourinary: Negative.   Musculoskeletal: Negative.   Skin: Negative.   Neurological: Negative.     Family History  Problem Relation Age of Onset  . Diabetes Mother   . Hypertension Mother      Past Medical History:  Diagnosis Date  . Gall bladder stones     History reviewed. No pertinent surgical history.  Social History:  reports that she has never smoked. She has never used smokeless tobacco. She reports that she drinks alcohol. She reports that she does not use drugs.  Allergies:  Allergies  Allergen Reactions  . Bromelains [Pineapple Extract] Diarrhea and Nausea And Vomiting    "extreme stomach problems"   . Metoclopramide Other (See Comments)    Makes her crazy     (Not in a hospital admission)  Blood pressure 124/72, pulse 68, temperature 98.3 F (36.8 C), temperature source Oral, resp. rate 16, height '5\' 2"'$  (1.575 m), weight 79.4 kg (175 lb), SpO2 100 %. Physical Exam: Physical Exam  Constitutional: She is oriented to person, place, and time. She appears well-developed and well-nourished.  Non-toxic appearance. No distress.  HENT:  Head: Normocephalic and atraumatic.  Eyes: Pupils are equal, round, and reactive to light. EOM are normal.  Cardiovascular: Normal rate, regular rhythm, normal heart sounds and intact distal pulses.  Pulmonary/Chest: Effort normal and breath sounds normal. No stridor. No respiratory distress. She has no wheezes. She has no rhonchi. She has no rales. She exhibits no tenderness.  Abdominal: Soft. Normal appearance and bowel sounds are normal. She exhibits no distension. There is no hepatosplenomegaly. There is tenderness in the right upper quadrant and epigastric area. There is no rigidity, no rebound, no guarding and negative Murphy's sign. No hernia.  obese  Neurological: She is alert and oriented to person, place, and time.  Skin: Skin is warm and dry. No rash noted. She is not diaphoretic. No erythema.  Psychiatric: She has  a normal mood and affect. Her behavior is normal.  Nursing note and vitals reviewed.   Results for orders placed or performed during the hospital encounter of 02/28/18 (from the past 48 hour(s))  Lipase,  blood     Status: None   Collection Time: 02/28/18  8:43 AM  Result Value Ref Range   Lipase 34 11 - 51 U/L    Comment: Performed at [facility], [place] [address], [place], [place] [address]  Comprehensive metabolic panel     Status: Abnormal   Collection Time: 02/28/18  8:43 AM  Result Value Ref Range   Sodium 137 135 - 145 mmol/L   Potassium 3.7 3.5 - 5.1 mmol/L   Chloride 105 101 - 111 mmol/L   CO2 21 (L) 22 - 32 mmol/L   Glucose, Bld 117 (H) 65 - 99 mg/dL   BUN 10 6 - 20 mg/dL   Creatinine, Ser 0.73 0.44 - 1.00 mg/dL   Calcium 8.8 (L) 8.9 - 10.3 mg/dL   Total Protein 7.2 6.5 - 8.1 g/dL   Albumin 3.9 3.5 - 5.0 g/dL   AST 33 15 - 41 U/L   ALT 32 14 - 54 U/L   Alkaline Phosphatase 78 38 - 126 U/L   Total Bilirubin 0.5 0.3 - 1.2 mg/dL   GFR calc non Af Amer >60 >60 mL/min   GFR calc Af Amer >60 >60 mL/min    Comment: (NOTE) The eGFR has been calculated using the CKD EPI equation. This calculation has not been validated in all clinical situations. eGFR's persistently <60 mL/min signify possible Chronic Kidney Disease.    Anion gap 11 5 - 15    Comment: Performed at [facility], [place] [address] [place], [place] [address]  CBC     Status: Abnormal   Collection Time: 02/28/18  8:43 AM  Result Value Ref Range   WBC 10.9 (H) 4.0 - 10.5 K/uL   RBC 5.10 3.87 - 5.11 MIL/uL   Hemoglobin 14.0 12.0 - 15.0 g/dL   HCT 42.1 36.0 - 46.0 %   MCV 82.5 78.0 - 100.0 fL   MCH 27.5 26.0 - 34.0 pg   MCHC 33.3 30.0 - 36.0 g/dL   RDW 14.0 11.5 - 15.5 %   Platelets 322 150 - 400 K/uL    Comment: Performed at [facility], [place] [address], [place], [place] [address]  Urinalysis, Routine w reflex microscopic     Status: Abnormal   Collection Time: 02/28/18 10:37 AM  Result Value Ref Range   Color, Urine STRAW (A) YELLOW   APPearance HAZY (A) CLEAR   Specific Gravity, Urine 1.008 1.005 - 1.030   pH 5.0 5.0 - 8.0   Glucose, UA NEGATIVE  NEGATIVE mg/dL   Hgb urine dipstick NEGATIVE NEGATIVE   Bilirubin Urine NEGATIVE NEGATIVE   Ketones, ur NEGATIVE NEGATIVE mg/dL   Protein, ur NEGATIVE NEGATIVE mg/dL   Nitrite NEGATIVE NEGATIVE   Leukocytes, UA SMALL (A) NEGATIVE   RBC / HPF 0-5 0 - 5 RBC/hpf   WBC, UA 0-5 0 - 5 WBC/hpf   Bacteria, UA RARE (A) NONE SEEN   Squamous Epithelial / LPF 11-20 0 - 5   Mucus PRESENT     Comment: Performed at [facility], [place] [address] [place], [place] [address]   US Abdomen Limited Ruq  Result Date: 02/28/2018 CLINICAL DATA:  Right upper quadrant pain EXAM: ULTRASOUND ABDOMEN LIMITED RIGHT UPPER QUADRANT COMPARISON:  CT abdomen and pelvis Feb 09, 2008 FINDINGS: Gallbladder: Within the gallbladder, there is either one large or multiple adherent echogenic foci which move and shadow consistent with cholelithiasis. This area of cholelithiasis measures 3.2 cm in length. There is no gallbladder wall thickening or pericholecystic fluid. No sonographic Murphy sign noted by sonographer. Common bile duct: Diameter: 3 mm. No intrahepatic or extrahepatic biliary duct dilatation. Liver: No focal lesion identified. Liver echogenicity overall is increased. Portal vein is patent on color Doppler imaging with normal direction of blood flow towards the liver. IMPRESSION: 1. Cholelithiasis. No gallbladder wall thickening or pericholecystic fluid. 2. Diffuse increase in liver echogenicity, a finding indicative of hepatic steatosis. While no focal liver lesions are evident on this study, it must be cautioned that the sensitivity of ultrasound for detection of focal liver lesions is diminished in this circumstance. Electronically Signed   By: Lowella Grip III M.D.   On: 02/28/2018 10:32      Assessment/Plan Symptomatic cholelithiasis, possible early calculus cholecystitis -  Patient with worsening symptoms over the past couple of years now presents with 6 days of constant abdominal pain associated  with nausea vomiting - afebrile, WBC 10,900, LFTs WNL  - N.p.o. - Plan to perform laparoscopic cholecystectomy today by Dr. Ralene Ok - Ancef on-call to the [facility] 02/28/2018, 2:02 PM Pager: [phone] Consults: [phone] Mon 7:00 am -11:30 AM Tues-Fri 7:00 am-4:30 pm Sat-Sun 7:00 am-11:30 am

## 2018-02-28 NOTE — ED Triage Notes (Signed)
Pt complaint of ongoing abdominal pain for 6 days; "I have a gallstone."

## 2018-02-28 NOTE — Transfer of Care (Signed)
Immediate Anesthesia Transfer of Care Note  Patient: Diane Taylor  Procedure(s) Performed: LAPAROSCOPIC CHOLECYSTECTOMY (N/A )  Patient Location: PACU  Anesthesia Type:General  Level of Consciousness: drowsy, patient cooperative and responds to stimulation  Airway & Oxygen Therapy: Patient Spontanous Breathing and Patient connected to face mask oxygen  Post-op Assessment: Report given to RN and Post -op Vital signs reviewed and stable  Post vital signs: Reviewed and stable  Last Vitals:  Vitals Value Taken Time  BP 159/103 02/28/2018  4:15 PM  Temp    Pulse 75 02/28/2018  4:16 PM  Resp 25 02/28/2018  4:16 PM  SpO2 99 % 02/28/2018  4:16 PM  Vitals shown include unvalidated device data.  Last Pain:  Vitals:   02/28/18 1427  TempSrc: Oral  PainSc: 0-No pain         Complications: No apparent anesthesia complications

## 2018-03-01 ENCOUNTER — Encounter (HOSPITAL_COMMUNITY): Payer: Self-pay | Admitting: General Surgery

## 2018-03-01 LAB — HIV ANTIBODY (ROUTINE TESTING W REFLEX): HIV SCREEN 4TH GENERATION: NONREACTIVE

## 2018-03-01 MED ORDER — OXYCODONE HCL 5 MG PO TABS: 5.0000 mg | ORAL_TABLET | Freq: Four times a day (QID) | ORAL | 0 refills | Status: AC | PRN

## 2018-03-01 NOTE — Discharge Summary (Signed)
[facility] Discharge Summary   Patient ID: Diane Taylor MRN: [id number] DOB/AGE: 32/31/1987 32 y.o.  Admit date: 02/28/2018 Discharge date: 03/01/2018  Admitting Diagnosis: Symptomatic cholelithiasis  Discharge Diagnosis Patient Active Problem List   Diagnosis Date Noted  . Symptomatic cholelithiasis 02/28/2018    Consultants None  Imaging: US Abdomen Limited Ruq  Result Date: 02/28/2018 CLINICAL DATA:  Right upper quadrant pain EXAM: ULTRASOUND ABDOMEN LIMITED RIGHT UPPER QUADRANT COMPARISON:  CT abdomen and pelvis Feb 09, 2008 FINDINGS: Gallbladder: Within the gallbladder, there is either one large or multiple adherent echogenic foci which move and shadow consistent with cholelithiasis. This area of cholelithiasis measures 3.2 cm in length. There is no gallbladder wall thickening or pericholecystic fluid. No sonographic Murphy sign noted by sonographer. Common bile duct: Diameter: 3 mm. No intrahepatic or extrahepatic biliary duct dilatation. Liver: No focal lesion identified. Liver echogenicity overall is increased. Portal vein is patent on color Doppler imaging with normal direction of blood flow towards the liver. IMPRESSION: 1. Cholelithiasis. No gallbladder wall thickening or pericholecystic fluid. 2. Diffuse increase in liver echogenicity, a finding indicative of hepatic steatosis. While no focal liver lesions are evident on this study, it must be cautioned that the sensitivity of ultrasound for detection of focal liver lesions is diminished in this circumstance. Electronically Signed   By: Bretta Bang III M.D.   On: 02/28/2018 10:32    Procedures Dr. Derrell Lolling (02/28/18) - Laparoscopic Cholecystectomy   Hospital Course:  Diane Taylor is a 32yo female who presented to [facility] 6/4 with 6 days of RUQ pain, nausea, vomiting diarrhea.  Workup showed symptomatic cholelithiasis.  Patient was admitted and underwent procedure listed above.  Tolerated  procedure well and was transferred to the floor.  Diet was advanced as tolerated.  On POD1, the patient was voiding well, tolerating diet, ambulating well, pain well controlled, vital signs stable, incisions c/d/i and felt stable for discharge home.  Patient will follow up as below and knows to call with questions or concerns.    I have personally reviewed the patients medication history on the [place] controlled substance database.    Physical Exam: General:  Alert, NAD, pleasant, comfortable Pulm: effort normal Abd:  Soft, ND, appropriately tender, multiple lap incisions C/D/I  Allergies as of 03/01/2018      Reactions   Bromelains [pineapple Extract] Diarrhea, Nausea And Vomiting   "extreme stomach problems"    Metoclopramide Other (See Comments)   Makes her crazy      Medication List    TAKE these medications   acetaminophen 500 MG tablet Commonly known as:  TYLENOL Take 500 mg by mouth every 6 (six) hours as needed for moderate pain.   dimenhyDRINATE 50 MG tablet Commonly known as:  DRAMAMINE Take 50 mg by mouth every 8 (eight) hours as needed for nausea.   ibuprofen 200 MG tablet Commonly known as:  ADVIL,MOTRIN Take 400 mg by mouth every 6 (six) hours as needed for moderate pain.   oxyCODONE 5 MG immediate release tablet Commonly known as:  Oxy IR/ROXICODONE Take 1 tablet (5 mg total) by mouth every 6 (six) hours as needed for severe pain.   ranitidine 150 MG tablet Commonly known as:  ZANTAC Take 150 mg by mouth 2 (two) times daily as needed for heartburn.        Follow-up Information    [facility], [place]. Call.   Specialty:  General Surgery Why:  We are working on your appointment, please call  to confirm. Please arrive 30 minutes prior to your appointment to check in and fill out paperwork. Bring photo ID and insurance information. Contact information: [address] [place] [id number] [phone]           Signed: Franne FortsBrooke A Meuth, [facility] 03/01/2018, 8:04 AM Pager: [phone] Consults: [phone] Mon 7:00 am -11:30 AM Tues-Fri 7:00 am-4:30 pm Sat-Sun 7:00 am-11:30 am

## 2018-03-01 NOTE — Plan of Care (Signed)
Pt alert and oriented, ambulating in halls.  Pain well controlled with PO pain meds. Slight drainage coming from umbilicus, gauze and tegaderm per surgery Pa.  Plan to d/c home per MD order.

## 2018-03-01 NOTE — Progress Notes (Signed)
Discharge paperwork discussed with patient and significant other. They both demonstrated understanding, and questions were answered.  Pt escorted by wheelchair to main lobby in stable condition.

## 2018-10-19 ENCOUNTER — Inpatient Hospital Stay
Admit: 2018-10-19 | Discharge: 2018-10-19 | Disposition: A | Discharge status: Home / Self Care | Attending: Emergency Medicine

## 2018-10-19 DIAGNOSIS — J069 Acute upper respiratory infection, unspecified: Secondary | ICD-10-CM

## 2018-10-19 LAB — STREP SCREEN GROUP A THROAT: Rapid Strep A Screen: NEGATIVE

## 2018-10-19 LAB — RAPID INFLUENZA A/B ANTIGENS
Rapid Influenza A Ag: NEGATIVE
Rapid Influenza B Ag: NEGATIVE

## 2018-10-19 NOTE — Discharge Instructions (Signed)
You may take Tylenol (acetaminophen) and/or Motrin (ibuprofen) with the medications that are prescribed for you, if you are permitted to take these medications. Please follow package directions for the appropriate dosing and frequency.

## 2018-10-19 NOTE — ED Notes (Signed)
Discharge instructions reviewed.  Patient verbalized understanding.  Work note given per patient request.       Latrelle Dodrill, RN  10/19/18 1143

## 2018-10-19 NOTE — ED Provider Notes (Signed)
[facility]      Pt Name: Mary Oneal  MRN: [id number]  [place] 10-Mar-1986  Date of evaluation: 10/19/2018  Provider: Domingo Madeira, DO    CHIEF COMPLAINT  History given by: PATIENT   Chief Complaint   Patient presents with   ??? Cough     since yesterday   ??? Nasal Congestion     sinus pressure    ??? Letter for School/Work     Patient stated came in for work note.     ??? Fever     Off and on        HPI  Luverne Farone is a 33 y.o. female who presents with upper respiratory symptoms including nasal congestion, sinus pressure, fever 102, chills muscle aches and headache.  She is also been sneezing.  Nothing seems to make it better or worse.  She describes her symptoms as severe.  Coworkers have been sick but no one at home is been sick.  She is had mild diarrhea.  She denies any nausea vomiting.  She is also had a sore throat.  She has had a flu exposure to people who have been formally diagnosed with flu.  ?  REVIEW OF SYSTEMS  All systems negative except as marked.  Reviewed Nurses' notes and concur.    Patient's last menstrual period was 09/28/2018.    PAST MEDICAL HISTORY  Past Medical History:   Diagnosis Date   ??? Bronchitis    ??? Headache    ??? Pneumonia        FAMILY HISTORY  History reviewed. No pertinent family history.    SOCIAL HISTORY   reports that she has never smoked. She has never used smokeless tobacco. She reports that she does not drink alcohol or use drugs.    SURGICAL HISTORY  Past Surgical History:   Procedure Laterality Date   ??? CHOLECYSTECTOMY         CURRENT MEDICATIONS      ALLERGIES  Allergies   Allergen Reactions   ??? Reglan [Metoclopramide]        PHYSICAL EXAM  VITAL SIGNS: BP 132/76    Pulse 75    Temp 98.3 ??F (36.8 ??C) (Oral)    Resp 20    Ht '5\' 2"'  (1.575 m)    Wt 235 lb 14.3 oz (107 kg)    LMP 09/28/2018    SpO2 100%    BMI 43.15 kg/m??   Constitutional: Well-developed, well-nourished, appears normal, nontoxic, activity:  Resting comfortably on the cart, using her cell phone, has her mask on to prevent spread of her symptoms.  HENT: Normocephalic, Atraumatic, Bilateral external ears normal, TM's were normal, Mucus membranes are moist and oropharynx is patent and clear, No oral exudates, Nose tenderness and erythematous with white discharge, oropharynx is erythematous.  Eyes:  Conjunctiva normal, No discharge. No scleral icterus.  Neck: Normal range of motion, No tenderness, Supple, mild anterior cervical adenopathy.  Lymphatic: No lymphadenopathy noted.  Cardiovascular: Normal heart rate, Normal rhythm, no murmurs, no gallops, no rubs.  Thorax & Lungs: Normal breath sounds, no respiratory distress, no wheezing, no rales, no rhonchi  Skin: Warm, Dry, No erythema, No rash.  Musculoskeletal: no major deformities noted.  Neurologic: Alert & oriented x 3  Psychiatric: Affect normal, Mood normal.    ?  LABORATORY  Labs Reviewed   RAPID INFLUENZA A/B ANTIGENS    Narrative:     Performed at:  [facility]                  [address]  [place] [address]   Phone [phone]   STREP SCREEN GROUP A THROAT    Narrative:     Performed at:                  [facility]                  [address]  [place] [address]   Phone [phone]   CULTURE BETA STREP CONFIRM PLATE       ?  COURSE & MEDICAL DECISION MAKING  Pertinent Labs reviewed. (See chart for details)    Vitals:    10/19/18 1041   BP: 132/76   Pulse: 75   Resp: 20   Temp: 98.3 ??F (36.8 ??C)   TempSrc: Oral   SpO2: 100%   Weight: 235 lb 14.3 oz (107 kg)   Height: '5\' 2"'  (1.575 m)       Medications - No data to display    New Prescriptions    No medications on file       SEP-1 CORE MEASURE DATA  Exclusion criteria: the patient is NOT to be included for sepsis due to:  SIRS criteria are not met    Patient remained stable in the ED. influenza A and B were negative.  Strep screen  was negative.  Therefore, patient was discharged with diagnosis of viral syndrome.  She was instructed to follow with her doctor in 3 to 5 days and return if any problems.  She was given a work excuse for today.  She was instructed to take Tylenol and Advil for symptoms.    The patient's blood pressure was found to be elevated according to CMS/Medicare and the Affordable Care Act/ObamaCare criteria. Elevated blood pressure could occur because of pain or anxiety or other reasons and does not mean that they need to have their blood pressure treated or medications otherwise adjusted. However, this could also be a sign that they will need to have their blood pressure treated or medications changed.    The patient was instructed to follow up closely with their personal physician to have their blood pressure rechecked. The patient was instructed to take a list of recent blood pressure readings to their next visit with their personal physician.    See discharge instructions for specific medications, discharge information, and treatments. They were verbally instructed to return to emergency if any problems.    (This chart has been completed using Estate manager/land agent. Although attempts have been made to ensure accuracy, words and/or phrases may not be transcribed as intended.)    Patient refused pain medicines at the time of his exam.    IMPRESSION(S):  1. Viral URI with cough    2. Nasal congestion    3. Encounter to obtain excuse from work        ?  Recheck Times: [place], [place]  10/19/18 1126

## 2018-10-19 NOTE — ED Triage Notes (Signed)
Patient came to ER with complaints of nasal congestion, cough, nausea, fever.  Patient stated needed a work note is the only reason she came in.

## 2018-10-21 LAB — CULTURE BETA STREP CONFIRM PLATE: Strep A Culture: NORMAL

## 2018-10-29 ENCOUNTER — Inpatient Hospital Stay
Admit: 2018-10-29 | Discharge: 2018-10-29 | Disposition: A | Discharge status: Home / Self Care | Attending: Emergency Medicine

## 2018-10-29 DIAGNOSIS — R112 Nausea with vomiting, unspecified: Secondary | ICD-10-CM

## 2018-10-29 LAB — CBC WITH AUTO DIFFERENTIAL
Basophils %: 0 %
Basophils Absolute: 0 10*3/uL (ref 0.0–0.2)
Eosinophils %: 0.2 %
Eosinophils Absolute: 0 10*3/uL (ref 0.0–0.6)
Hematocrit: 46.3 % (ref 36.0–48.0)
Hemoglobin: 14.8 g/dL (ref 12.0–16.0)
Lymphocytes %: 3.3 %
Lymphocytes Absolute: 0.4 10*3/uL — ABNORMAL LOW (ref 1.0–5.1)
MCH: 26.3 pg (ref 26.0–34.0)
MCHC: 32 g/dL (ref 31.0–36.0)
MCV: 82.1 fL (ref 80.0–100.0)
MPV: 9.1 fL (ref 5.0–10.5)
Monocytes %: 1.6 %
Monocytes Absolute: 0.2 10*3/uL (ref 0.0–1.3)
Neutrophils %: 94.9 %
Neutrophils Absolute: 10.9 10*3/uL — ABNORMAL HIGH (ref 1.7–7.7)
Platelets: 347 10*3/uL (ref 135–450)
RBC: 5.64 M/uL — ABNORMAL HIGH (ref 4.00–5.20)
RDW: 13.7 % (ref 12.4–15.4)
WBC: 11.5 10*3/uL — ABNORMAL HIGH (ref 4.0–11.0)

## 2018-10-29 LAB — URINALYSIS WITH REFLEX TO CULTURE
Bilirubin Urine: NEGATIVE
Blood, Urine: NEGATIVE
Glucose, Ur: NEGATIVE mg/dL
Ketones, Urine: NEGATIVE mg/dL
Leukocyte Esterase, Urine: NEGATIVE
Nitrite, Urine: NEGATIVE
Protein, UA: 30 mg/dL — AB
Specific Gravity, UA: 1.025 (ref 1.005–1.030)
Urobilinogen, Urine: 0.2 E.U./dL (ref <2.0)
pH, UA: 5.5 (ref 5.0–8.0)

## 2018-10-29 LAB — MICROSCOPIC URINALYSIS

## 2018-10-29 LAB — RAPID INFLUENZA A/B ANTIGENS
Rapid Influenza A Ag: NEGATIVE
Rapid Influenza B Ag: NEGATIVE

## 2018-10-29 LAB — LIPASE: Lipase: 18 U/L (ref 13.0–60.0)

## 2018-10-29 LAB — COMPREHENSIVE METABOLIC PANEL
ALT: 27 U/L (ref 10–40)
AST: 31 U/L (ref 15–37)
Albumin/Globulin Ratio: 1.2 (ref 1.1–2.2)
Albumin: 4.2 g/dL (ref 3.4–5.0)
Alkaline Phosphatase: 92 U/L (ref 40–129)
Anion Gap: 14 (ref 3–16)
BUN: 10 mg/dL (ref 7–20)
CO2: 23 mmol/L (ref 21–32)
Calcium: 9.1 mg/dL (ref 8.3–10.6)
Chloride: 104 mmol/L (ref 99–110)
Creatinine: 0.8 mg/dL (ref 0.6–1.1)
GFR African American: >60 (ref >60)
GFR Non-African American: >60 (ref >60)
Globulin: 3.5 g/dL
Glucose: 164 mg/dL — ABNORMAL HIGH (ref 70–99)
Potassium: 4.1 mmol/L (ref 3.5–5.1)
Sodium: 141 mmol/L (ref 136–145)
Total Bilirubin: 0.6 mg/dL (ref 0.0–1.0)
Total Protein: 7.7 g/dL (ref 6.4–8.2)

## 2018-10-29 LAB — PREGNANCY, URINE: HCG(Urine) Pregnancy Test: NEGATIVE

## 2018-10-29 MED ORDER — ONDANSETRON HCL 4 MG/2ML IJ SOLN
4 MG/2ML | Freq: Once | INTRAMUSCULAR | Status: AC
Start: 2018-10-29 — End: 2018-10-29
  Administered 2018-10-29: 15:00:00 4 mg via INTRAVENOUS

## 2018-10-29 MED ORDER — ACETAMINOPHEN 325 MG PO TABS
325 MG | Freq: Once | ORAL | Status: AC
Start: 2018-10-29 — End: 2018-10-29
  Administered 2018-10-29: 16:00:00 650 mg via ORAL

## 2018-10-29 MED ORDER — ONDANSETRON 4 MG PO TBDP
4 MG | ORAL_TABLET | Freq: Three times a day (TID) | ORAL | 0 refills | Status: AC | PRN
Start: 2018-10-29 — End: ?

## 2018-10-29 MED ORDER — SODIUM CHLORIDE 0.9 % IV BOLUS
0.9 % | Freq: Once | INTRAVENOUS | Status: AC
Start: 2018-10-29 — End: 2018-10-29
  Administered 2018-10-29: 15:00:00 1000 mL via INTRAVENOUS

## 2018-10-29 MED FILL — ONDANSETRON HCL 4 MG/2ML IJ SOLN: 4 MG/2ML | INTRAMUSCULAR | Qty: 2

## 2018-10-29 MED FILL — ACETAMINOPHEN 325 MG PO TABS: 325 mg | ORAL | Qty: 2

## 2018-10-29 MED FILL — SODIUM CHLORIDE 0.9 % IV SOLN: 0.9 % | INTRAVENOUS | Qty: 1000

## 2018-10-29 NOTE — ED Provider Notes (Signed)
Triage Chief Complaint:   Emesis (And diarrhea since 0230.)    HOPI:  Mary Oneal is a 33 y.o. female that presents complaining of nonbilious nonbloody emesis and diarrhea since approximately 2 AM this morning.  No abdominal pain other than when she is actively vomiting.  No fever no chills no headache    ROS:  At least 12 systems reviewed and otherwise acutely negative except as in the HOPI.    Past Medical History:   Diagnosis Date   ??? Bronchitis    ??? Headache    ??? Pneumonia      Past Surgical History:   Procedure Laterality Date   ??? CHOLECYSTECTOMY       History reviewed. No pertinent family history.  Social History     Socioeconomic History   ??? Marital status: Divorced     Spouse name: Not on file   ??? Number of children: Not on file   ??? Years of education: Not on file   ??? Highest education level: Not on file   Occupational History   ??? Not on file   Social Needs   ??? Financial resource strain: Not on file   ??? Food insecurity:     Worry: Not on file     Inability: Not on file   ??? Transportation needs:     Medical: Not on file     Non-medical: Not on file   Tobacco Use   ??? Smoking status: Never Smoker   ??? Smokeless tobacco: Never Used   Substance and Sexual Activity   ??? Alcohol use: Never     Frequency: Never   ??? Drug use: Never   ??? Sexual activity: Yes   Lifestyle   ??? Physical activity:     Days per week: Not on file     Minutes per session: Not on file   ??? Stress: Not on file   Relationships   ??? Social connections:     Talks on phone: Not on file     Gets together: Not on file     Attends religious service: Not on file     Active member of club or organization: Not on file     Attends meetings of clubs or organizations: Not on file     Relationship status: Not on file   ??? Intimate partner violence:     Fear of current or ex partner: Not on file     Emotionally abused: Not on file     Physically abused: Not on file     Forced sexual activity: Not on file   Other Topics Concern   ??? Not on file   Social History  Narrative   ??? Not on file     Current Facility-Administered Medications   Medication Dose Route Frequency Provider Last Rate Last Dose   ??? 0.9 % sodium chloride bolus  1,000 mL Intravenous Once Dewaine Conger, MD 1,000 mL/hr at 10/29/18 1007 1,000 mL at 10/29/18 1007     No current outpatient medications on file.     Allergies   Allergen Reactions   ??? Reglan [Metoclopramide]        @TDSCREENINGS @    Nursing Notes Reviewed    Physical Exam:  Vitals:    10/29/18 1001   BP: 110/69   Pulse: 92   Resp: 17   Temp: 98 ??F (36.7 ??C)   SpO2: 96%       GENERAL APPEARANCE: Awake and alert. Cooperative. No acute  distress.  HEAD: Normocephalic. Atraumatic.  EYES: EOM's grossly intact. Sclera anicteric.  ENT: Mucous membranes are moist. Tolerates saliva. No trismus.  NECK: Supple. No meningismus. Trachea midline.  HEART: RRR. Radial pulses 2+.  LUNGS: Respirations unlabored. CTAB  ABDOMEN: Soft. Non-tender. No guarding or rebound.  EXTREMITIES: No acute deformities.  SKIN: Warm and dry.  NEUROLOGICAL: No gross facial drooping. Moves all 4 extremities spontaneously.  PSYCHIATRIC: Normal mood.    I have reviewed and interpreted all of the currently available lab results from this visit (if applicable):  No results found for this visit on 10/29/18.     Radiographs (if obtained):  []  The following radiograph was interpreted by myself in the absence of a radiologist:  [x]  Radiologist's Report Reviewed:  n/a    EKG (if obtained): (All EKG's are interpreted by myself in the absence of a cardiologist)  Initial EKG on my interpretation shows n/a.    MDM:  Differential diagnosis: Pregnancy, UTI, influenza, appendicitis    The patient's labs are not impressive.  She is not pregnant and has no UTI.  Flu is negative.  After IV fluids the patient feels better is eager for discharge but does request a work note.  Work note provided along with Zofran and GI follow-up.  I estimate there is LOW risk for ACUTE APPENDICITIS, BOWEL OBSTRUCTION,  CHOLECYSTITIS, DIVERTICULITIS, INCARCERATED HERNIA, PANCREATITIS, PERFORATED BOWEL, BOWEL ISCHEMIA, GONADAL TORSION, OR CARDIAC ISCHEMIA, thus I consider the discharge disposition reasonable. Also, there is no evidence or peritonitis, sepsis, or toxicity.  Discussed results, diagnosis and plan with patient and/or family. Questions addressed. Disposition and follow-up agreed upon. Specific discharge instructions explained. The patient and/or family and I have discussed the diagnosis and risks, and we agree with discharging home to follow-up with their primary care, specialist or referral doctor.  In the event that medications were prescribed the risk profile of these medications were detailed expressly. We also discussed returning to the Emergency Department immediately if new or worsening symptoms occur. We have discussed the symptoms which are most concerning that necessitate immediate return.    Old records reviewed. Labs and imaging reviewed and results discussed with patient.        Patient was given scripts for the following medications. I counseled patient how to take these medications.   New Prescriptions    No medications on file         CRITICAL CARE TIME   Total Critical Care time was 0 minutes, excluding separately reportable procedures.  There was a high probability of clinically significant/life threatening deterioration in the patient's condition which required my urgent intervention.      Clinical Impression:  Nausea, vomiting, diarrhea  (Please note that portions of this note may have been completed with a voice recognition program. Efforts were made to edit the dictations but occasionally words are mis-transcribed.)    Francis Dowse, MD         Dewaine Conger, MD  10/29/18 [id number]

## 2018-10-29 NOTE — ED Notes (Signed)
Pt requesting pain medication at this time md mazin aware awaiting for new orders      Alfredo Bach, RN  10/29/18 1207

## 2018-10-29 NOTE — ED Triage Notes (Signed)
Patient developed vomiting and diarrhea around 0230 this morning.  States her boyfriend had similar symptoms 2 days ago.  She took one of his zofran around 0400.  States she had a fever, too.  No dysuria.

## 2019-07-17 IMAGING — US US ABDOMEN LIMITED
1 series · 14 of 25 positions shown · non-contrast
Comparison: CT abdomen and pelvis February 09, 2008

CLINICAL DATA: Right upper quadrant pain

EXAM:
ULTRASOUND ABDOMEN LIMITED RIGHT UPPER QUADRANT

[Series 1: us abdomen limited · 0.30mm/px · 14 of 83 slices shown]
[im 1/83]
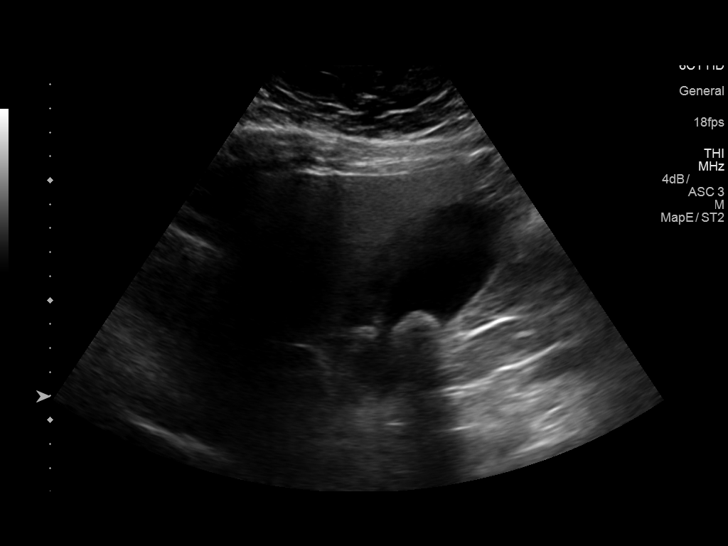
[im 7/83]
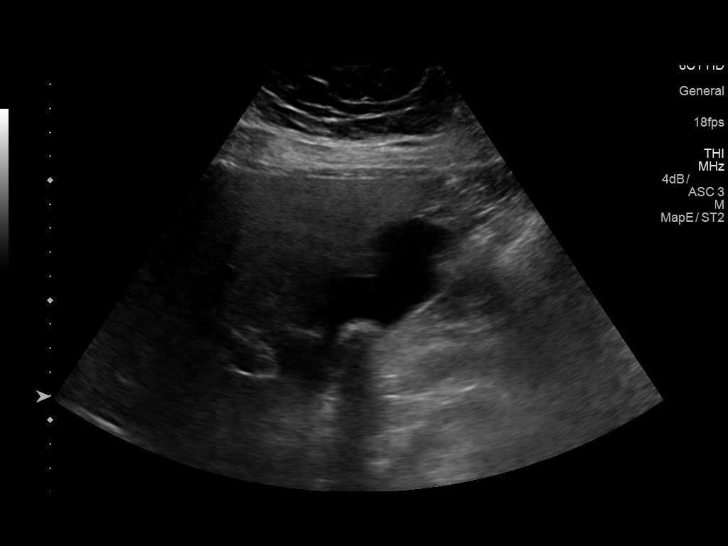
[im 14/83]
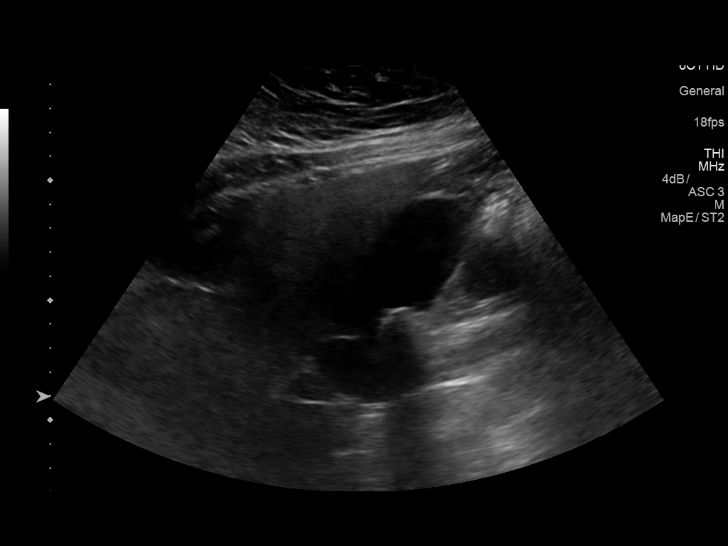
[im 21/83]
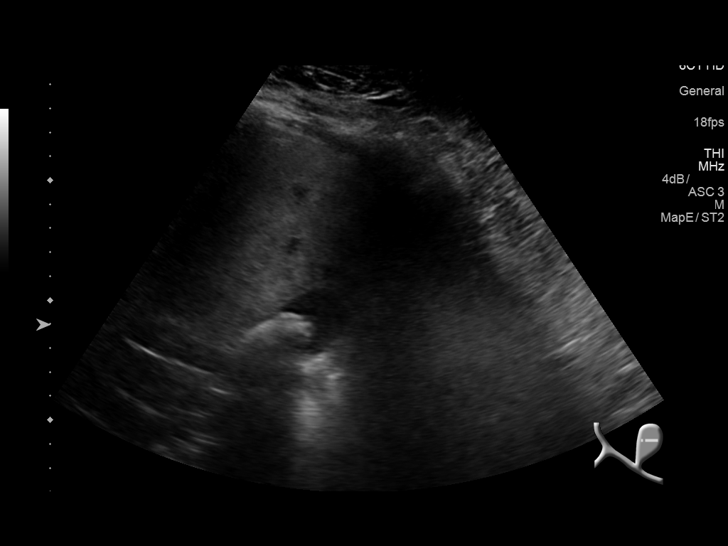
[im 28/83]
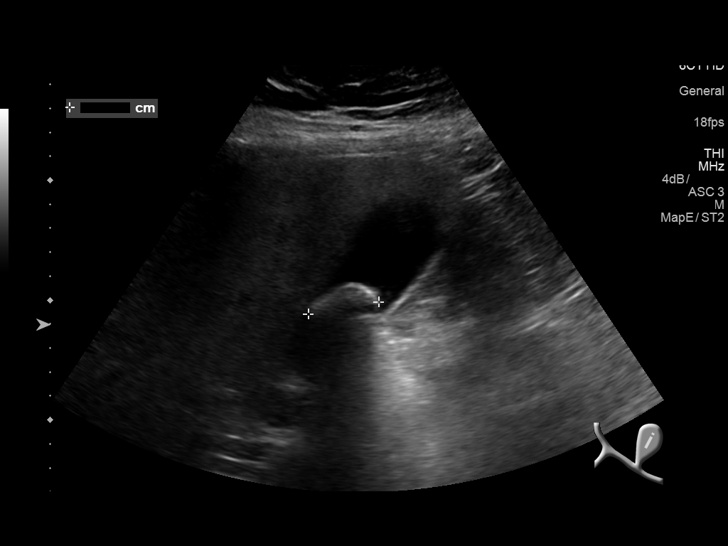
[im 31/83]
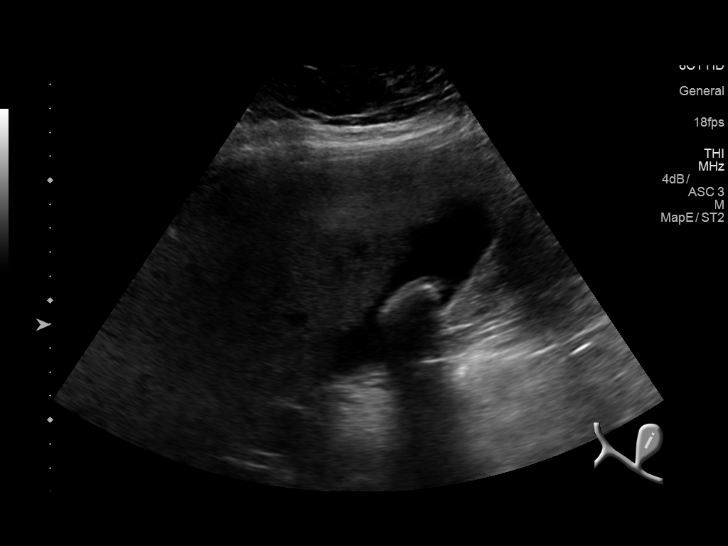
[im 38/83]
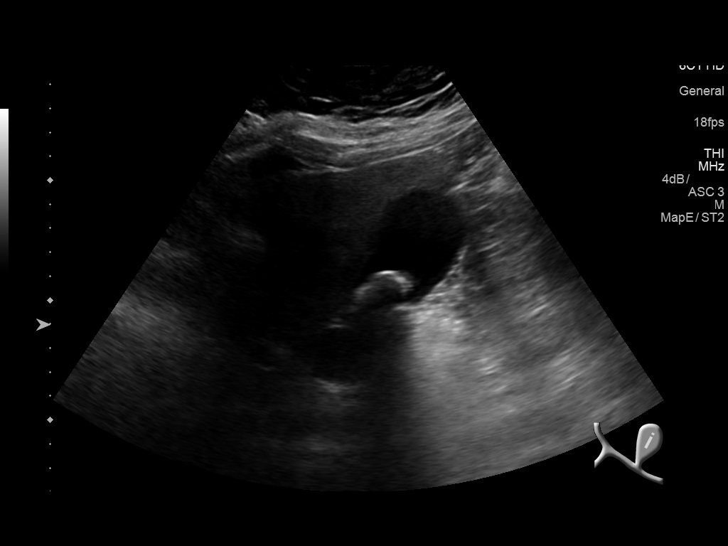
[im 45/83]
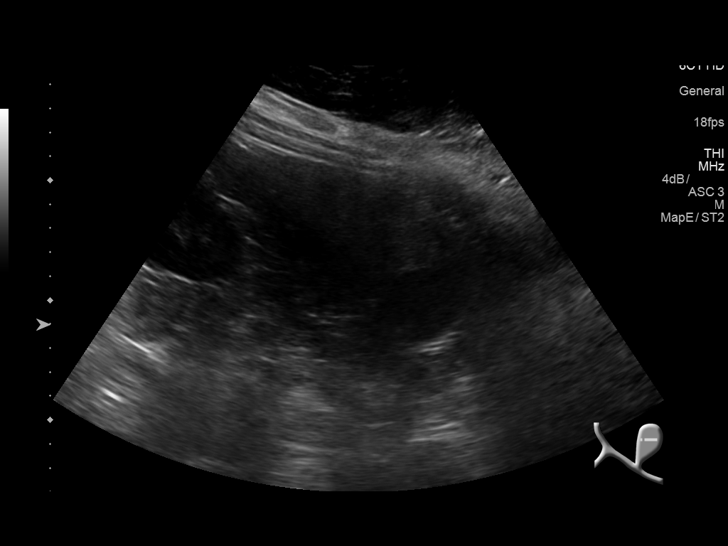
[im 52/83]
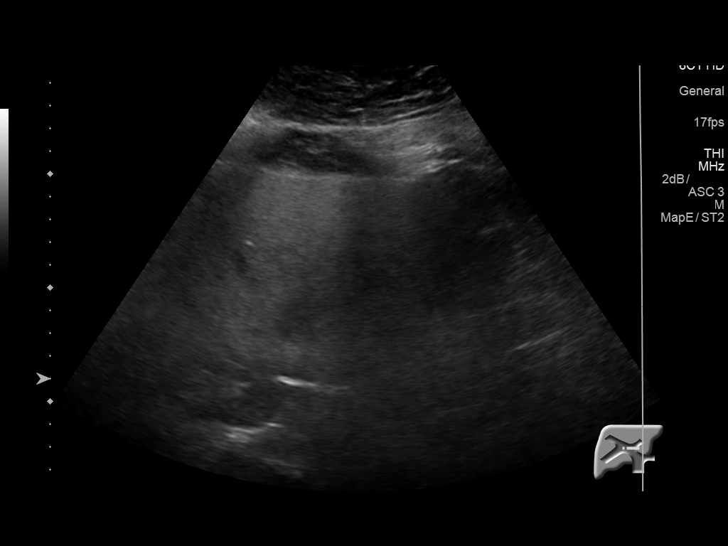
[im 55/83]
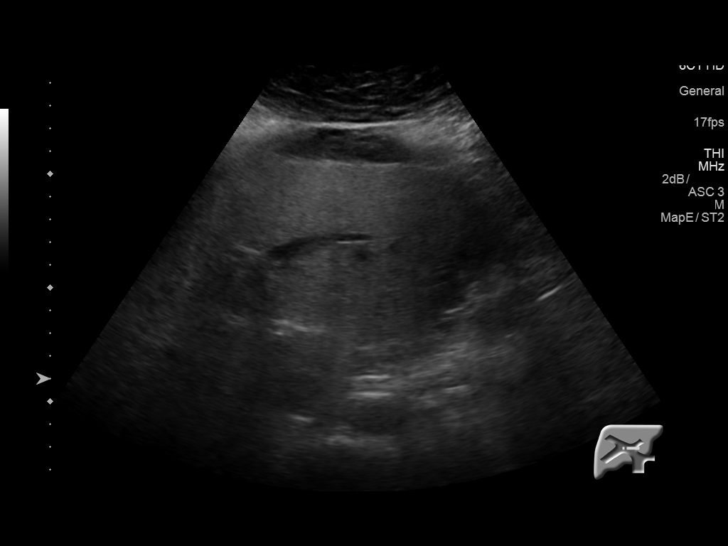
[im 62/83]
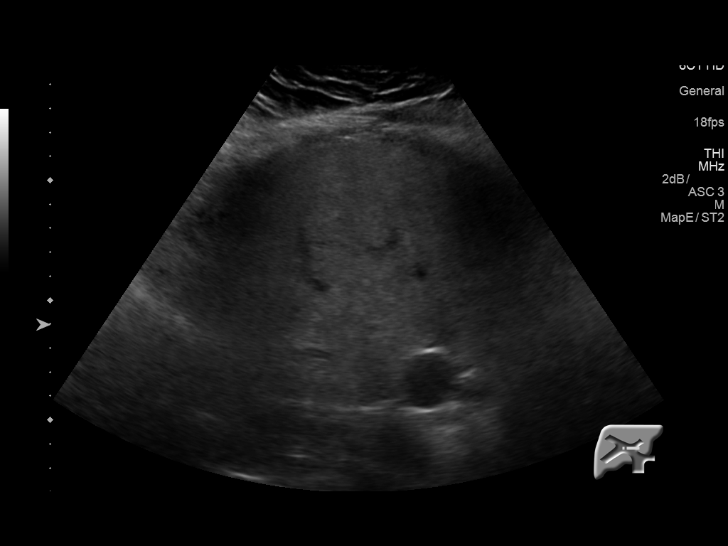
[im 69/83]
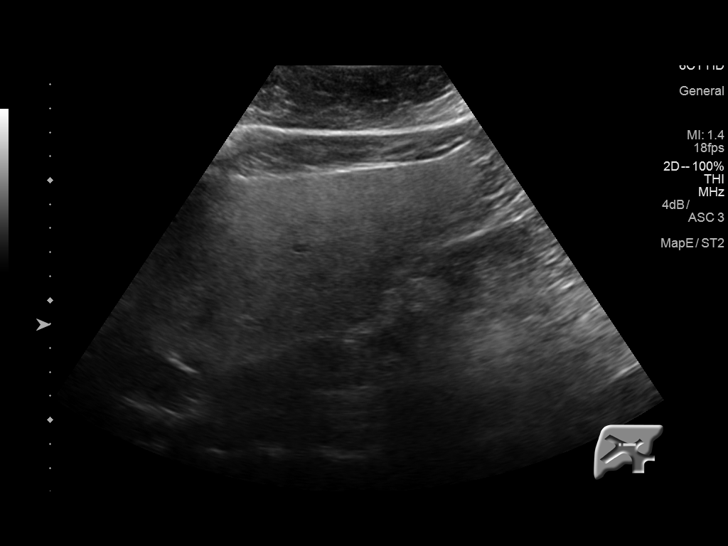
[im 76/83]
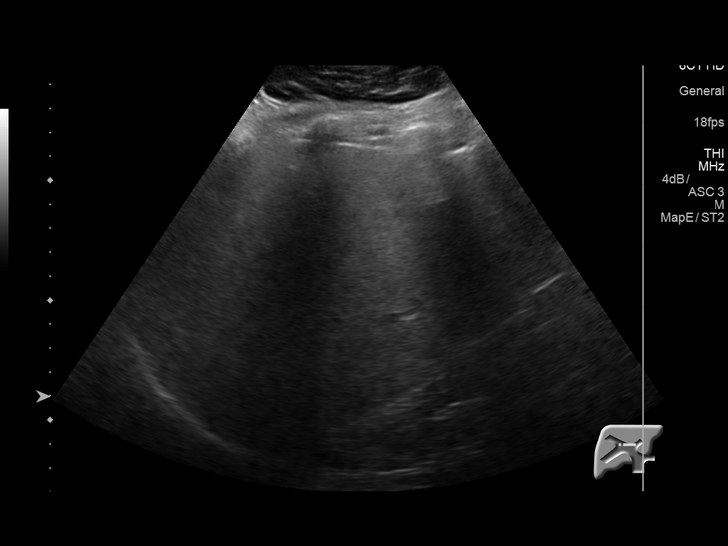
[im 83/83]
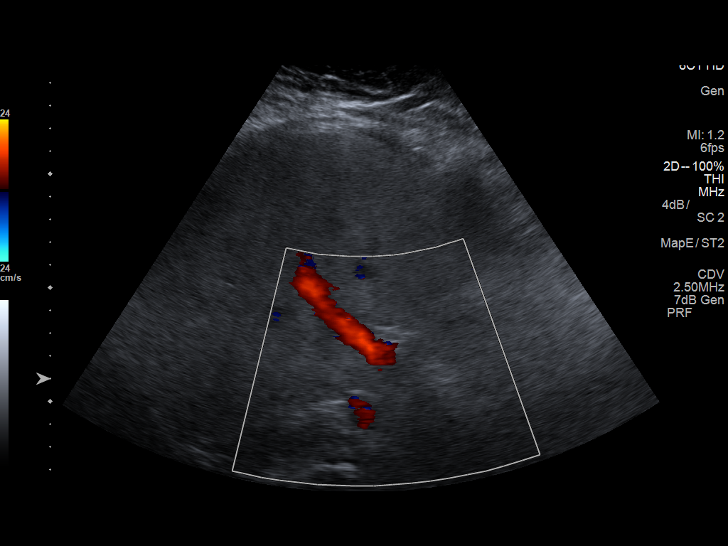

[14 of 25 positions shown; findings below may reference images not displayed]

FINDINGS: Gallbladder:

Within the gallbladder, there is either one large or multiple
adherent echogenic foci which move and shadow consistent with
cholelithiasis. This area of cholelithiasis measures 3.2 cm in
length. There is no gallbladder wall thickening or pericholecystic
fluid. No sonographic Murphy sign noted by sonographer.

Common bile duct:

Diameter: 3 mm. No intrahepatic or extrahepatic biliary duct
dilatation.

Liver:

No focal lesion identified. Liver echogenicity overall is
increased.. Portal vein is patent on color Doppler imaging with
normal direction of blood flow towards the liver.
IMPRESSION: 1. Cholelithiasis. No gallbladder wall thickening or pericholecystic
fluid.

2. Diffuse increase in liver echogenicity, a finding indicative of
hepatic steatosis. While no focal liver lesions are evident on this
study, it must be cautioned that the sensitivity of ultrasound for
detection of focal liver lesions is diminished in this circumstance.

## 2021-02-25 ENCOUNTER — Encounter: Attending: Nephrology | Primary: Family Medicine

## 2021-03-09 ENCOUNTER — Encounter: Attending: Nephrology | Primary: Family Medicine
